# Patient Record
Sex: Female | Born: 1970 | Race: White | Hispanic: No | Marital: Single | Smoking: Current every day smoker
Health system: Southern US, Community
[De-identification: ages and names within clinical notes are randomized; demographics above are authoritative.]

## PROBLEM LIST (undated history)

## (undated) DIAGNOSIS — J45909 Unspecified asthma, uncomplicated: Secondary | ICD-10-CM

## (undated) DIAGNOSIS — M549 Dorsalgia, unspecified: Secondary | ICD-10-CM

## (undated) DIAGNOSIS — F419 Anxiety disorder, unspecified: Secondary | ICD-10-CM

## (undated) DIAGNOSIS — M1990 Unspecified osteoarthritis, unspecified site: Secondary | ICD-10-CM

## (undated) HISTORY — PX: TUBAL LIGATION: SHX77

---

## 2004-09-17 ENCOUNTER — Inpatient Hospital Stay: Payer: Self-pay

## 2005-09-12 ENCOUNTER — Emergency Department: Payer: Self-pay | Admitting: General Practice

## 2006-01-06 ENCOUNTER — Emergency Department: Payer: Self-pay | Admitting: Emergency Medicine

## 2006-03-20 ENCOUNTER — Emergency Department: Payer: Self-pay | Admitting: Emergency Medicine

## 2006-03-22 ENCOUNTER — Other Ambulatory Visit: Payer: Self-pay

## 2006-03-22 ENCOUNTER — Emergency Department: Payer: Self-pay | Admitting: Emergency Medicine

## 2006-07-09 ENCOUNTER — Emergency Department: Payer: Self-pay

## 2007-01-29 ENCOUNTER — Emergency Department: Payer: Self-pay | Admitting: Emergency Medicine

## 2007-01-30 ENCOUNTER — Emergency Department: Payer: Self-pay | Admitting: Emergency Medicine

## 2007-11-09 ENCOUNTER — Emergency Department: Payer: Self-pay | Admitting: Internal Medicine

## 2007-11-11 ENCOUNTER — Inpatient Hospital Stay: Payer: Self-pay | Admitting: Internal Medicine

## 2007-11-13 ENCOUNTER — Inpatient Hospital Stay: Payer: Self-pay | Admitting: Internal Medicine

## 2007-11-13 ENCOUNTER — Other Ambulatory Visit: Payer: Self-pay

## 2008-09-05 ENCOUNTER — Emergency Department: Payer: Self-pay | Admitting: Emergency Medicine

## 2009-05-12 ENCOUNTER — Inpatient Hospital Stay: Payer: Self-pay | Admitting: Internal Medicine

## 2009-07-02 ENCOUNTER — Inpatient Hospital Stay: Payer: Self-pay | Admitting: Internal Medicine

## 2009-10-09 ENCOUNTER — Observation Stay: Payer: Self-pay | Admitting: Internal Medicine

## 2009-11-07 ENCOUNTER — Emergency Department: Payer: Self-pay | Admitting: Emergency Medicine

## 2010-01-04 ENCOUNTER — Observation Stay: Payer: Self-pay | Admitting: Internal Medicine

## 2010-02-22 ENCOUNTER — Emergency Department: Payer: Self-pay | Admitting: Emergency Medicine

## 2010-02-27 ENCOUNTER — Emergency Department: Payer: Self-pay | Admitting: Emergency Medicine

## 2010-11-28 ENCOUNTER — Emergency Department: Payer: Self-pay | Admitting: Emergency Medicine

## 2010-11-30 ENCOUNTER — Inpatient Hospital Stay: Payer: Self-pay | Admitting: Internal Medicine

## 2011-01-23 ENCOUNTER — Emergency Department: Payer: Self-pay | Admitting: Unknown Physician Specialty

## 2011-05-11 IMAGING — CR DG CHEST 2V
1 series · 2 of 2 positions shown · non-contrast
Comparison: none

REASON FOR EXAM: sob
COMMENTS:

PROCEDURE:     DXR - DXR CHEST PA (OR AP) AND LATERAL  - October 10, 2009 [DATE]
RESULT:     Comparison: 10/09/2009

[Series 1: view not recorded · 0.17mm/px · 2 of 2 slices shown]
[im 1/2]
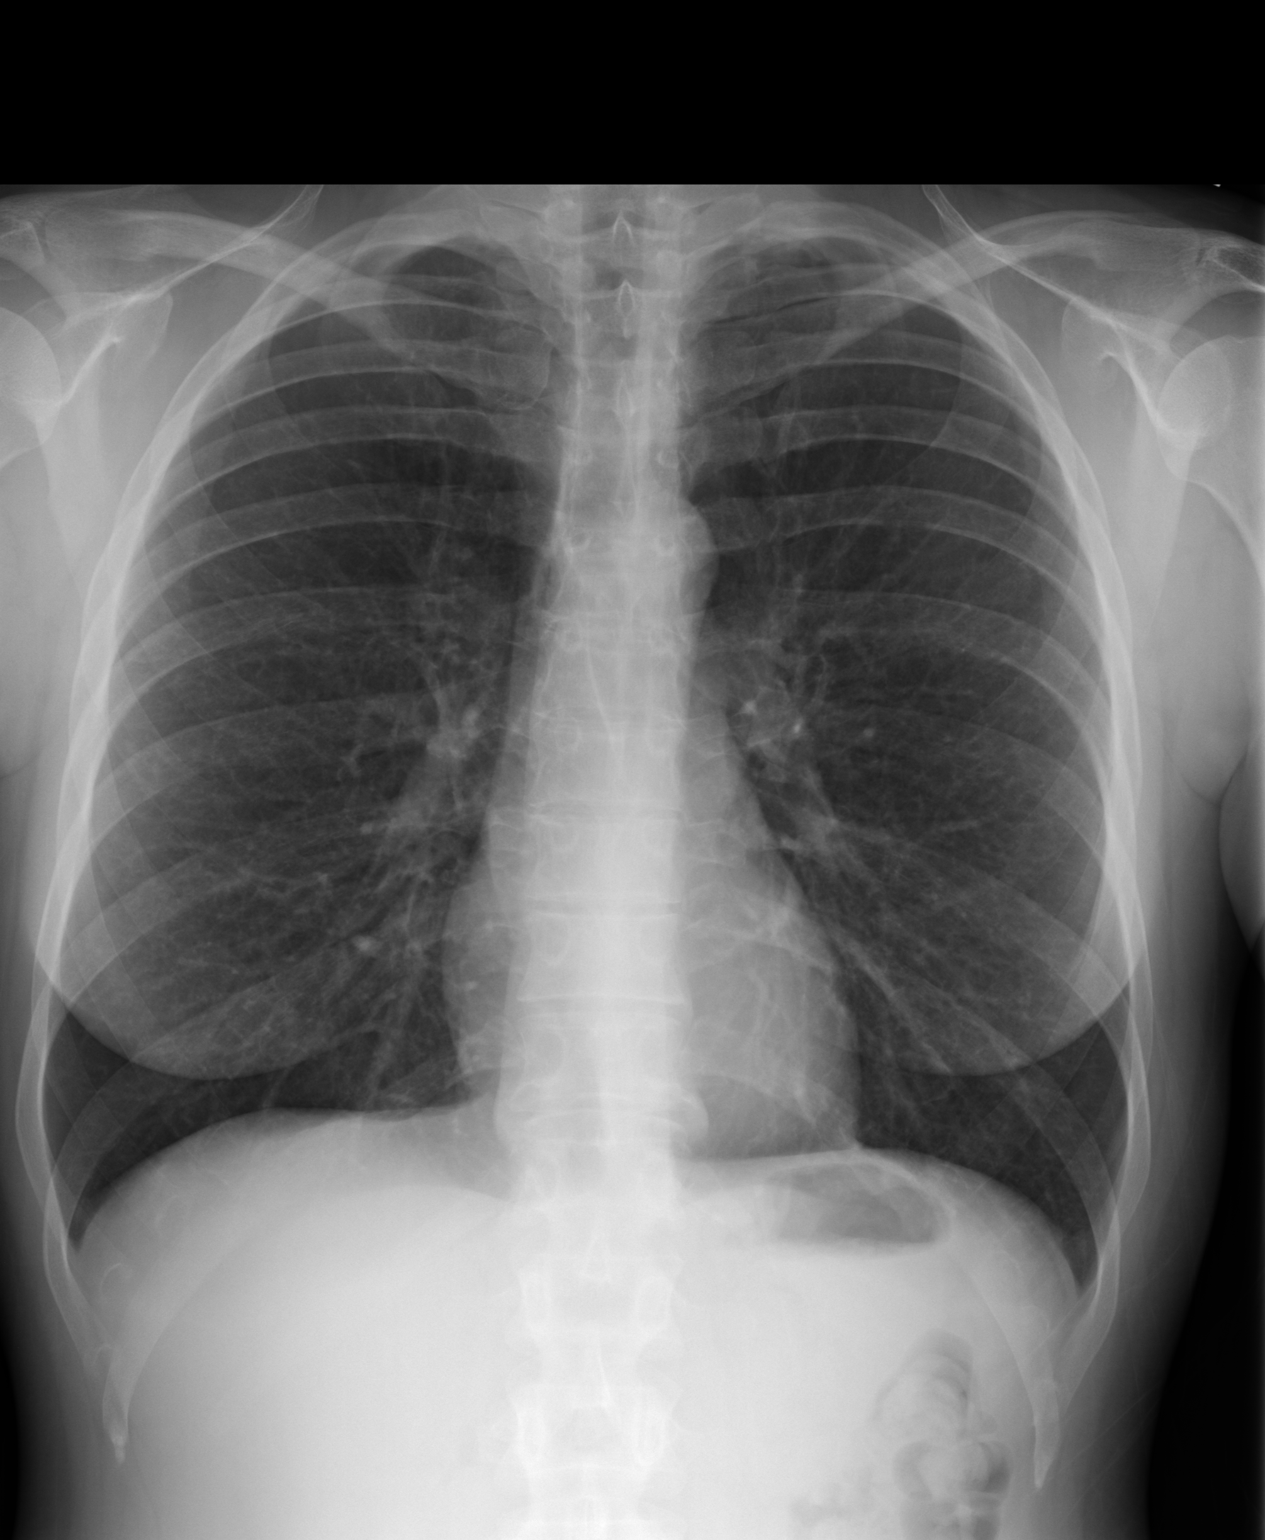
[im 2/2]
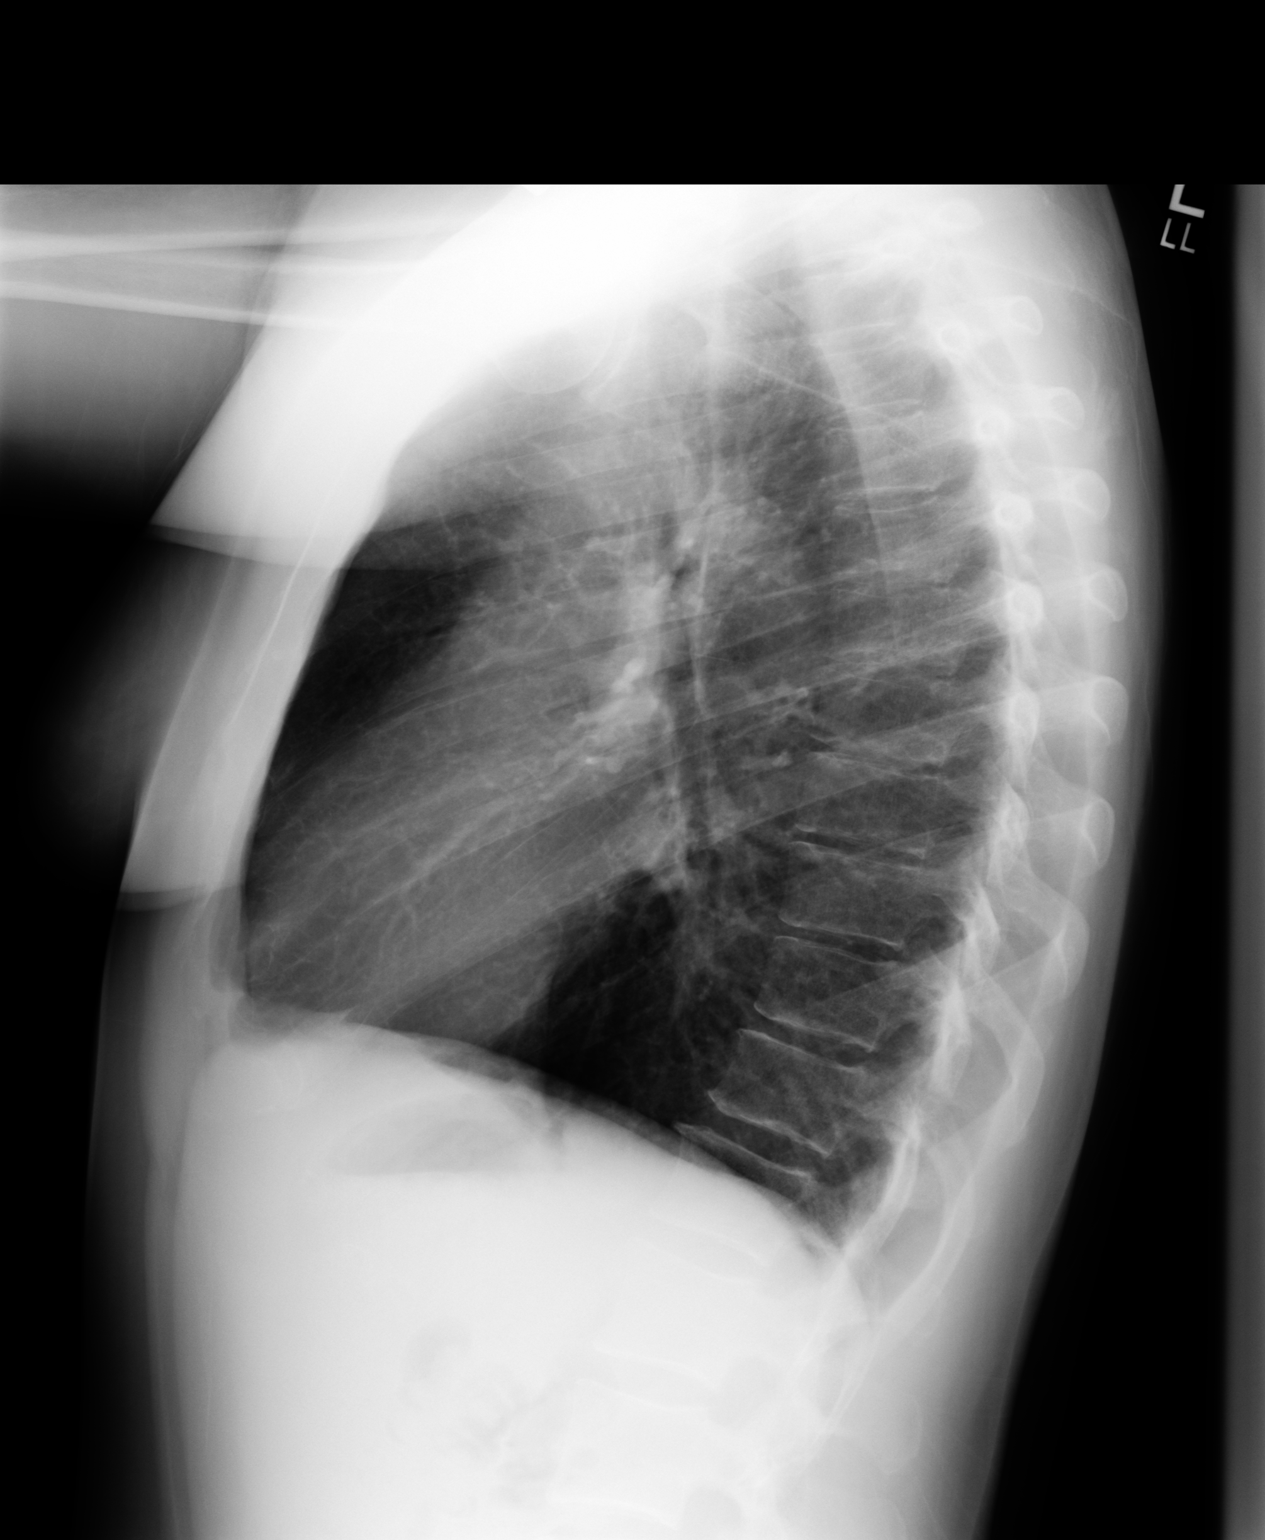

[2 of 2 positions shown; findings below may reference images not displayed]

FINDINGS: PA and lateral chest radiographs are provided. The lungs are hyperinflated
likely secondary to COPD. There is no focal parenchymal opacity, pleural
effusion, or pneumothorax. The heart and mediastinum are unremarkable. The
osseous structures are unremarkable.
IMPRESSION: No acute disease of the chest.

## 2011-12-15 ENCOUNTER — Emergency Department: Payer: Self-pay | Admitting: Emergency Medicine

## 2012-06-28 IMAGING — CR DG CHEST 1V PORT
1 series · 1 of 1 positions shown · non-contrast
Comparison: none

REASON FOR EXAM: sob
COMMENTS:

PROCEDURE:     DXR - DXR PORTABLE CHEST SINGLE VIEW  - November 28, 2010  [DATE]
RESULT:     Comparison is made to the prior exam of 02/22/2010.
The lung fields are clear. The heart, mediastinal and osseous structures
reveal no significant abnormalities.

[view not recorded]
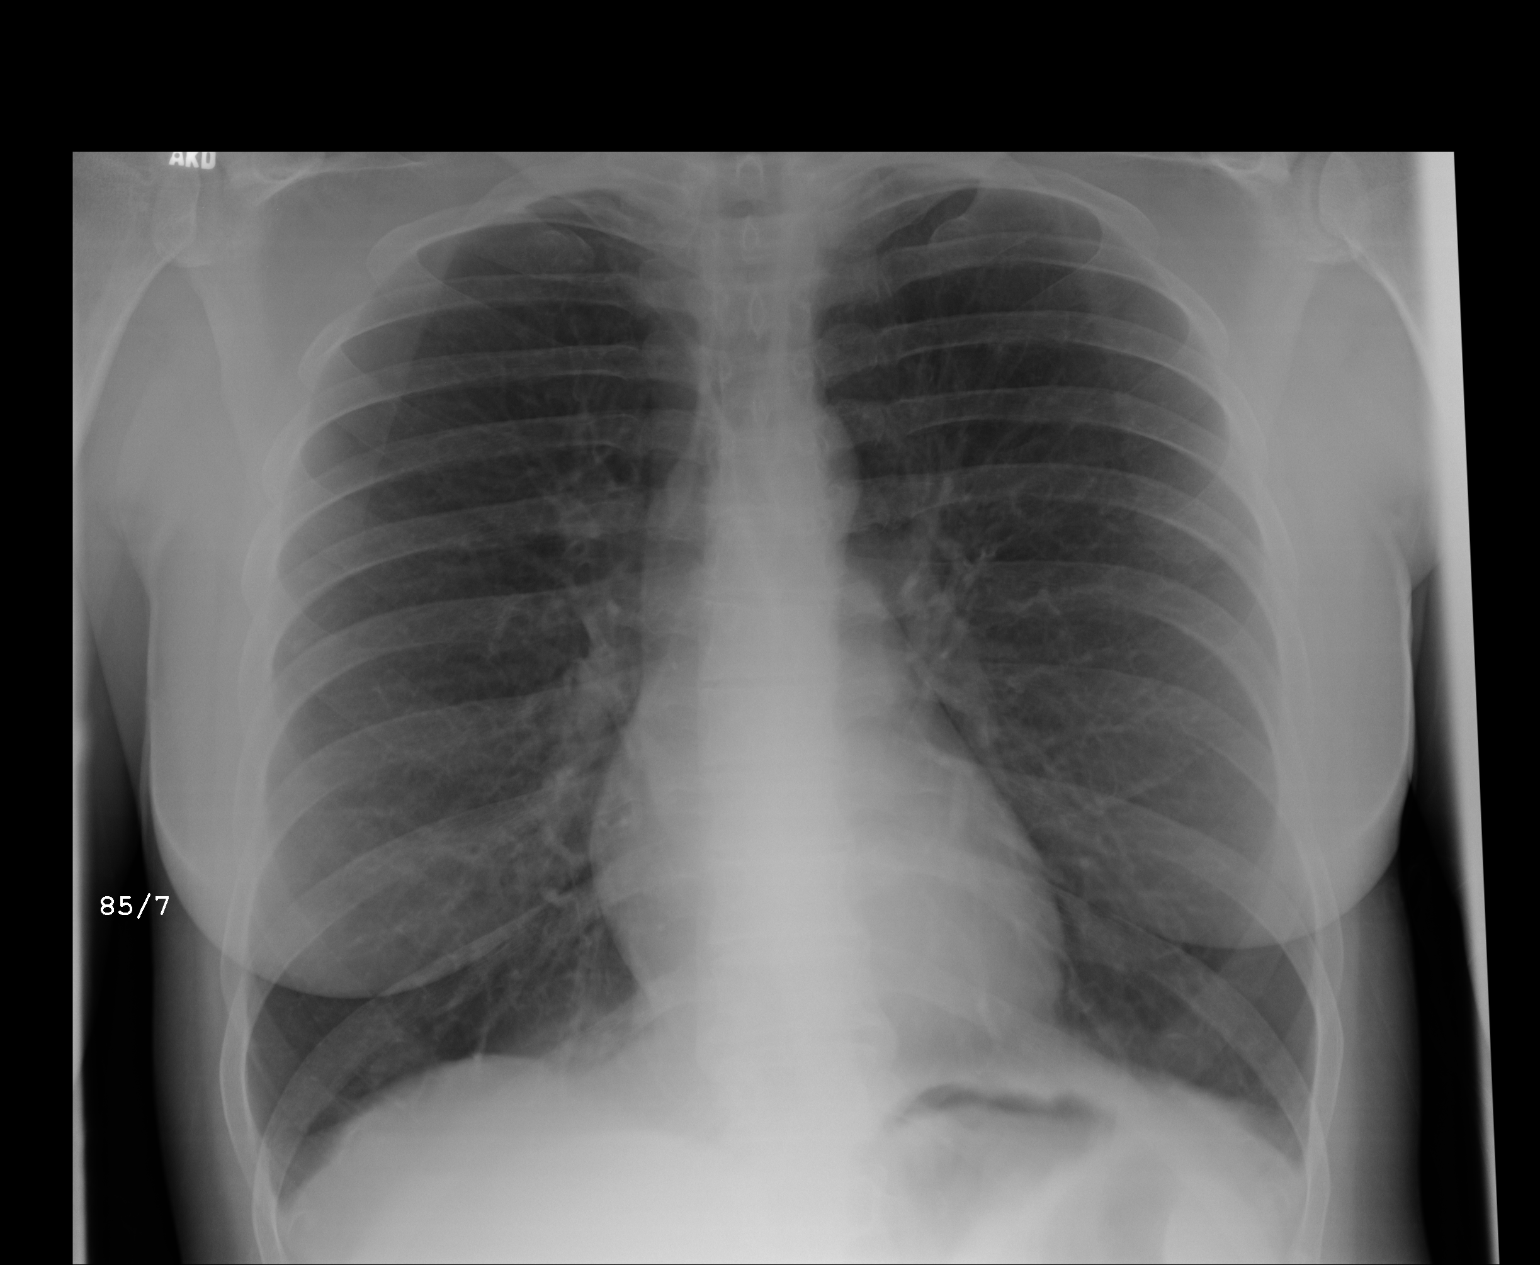

[1 of 1 positions shown; findings below may reference images not displayed]

IMPRESSION: No acute changes are identified.

## 2012-06-30 IMAGING — CR DG CHEST 2V
1 series · 2 of 2 positions shown · non-contrast
Comparison: none

REASON FOR EXAM: Shortness of Breath
COMMENTS:   May transport without cardiac monitor

[Series 1: view not recorded · 0.17mm/px · 2 of 2 slices shown]
[im 1/2]
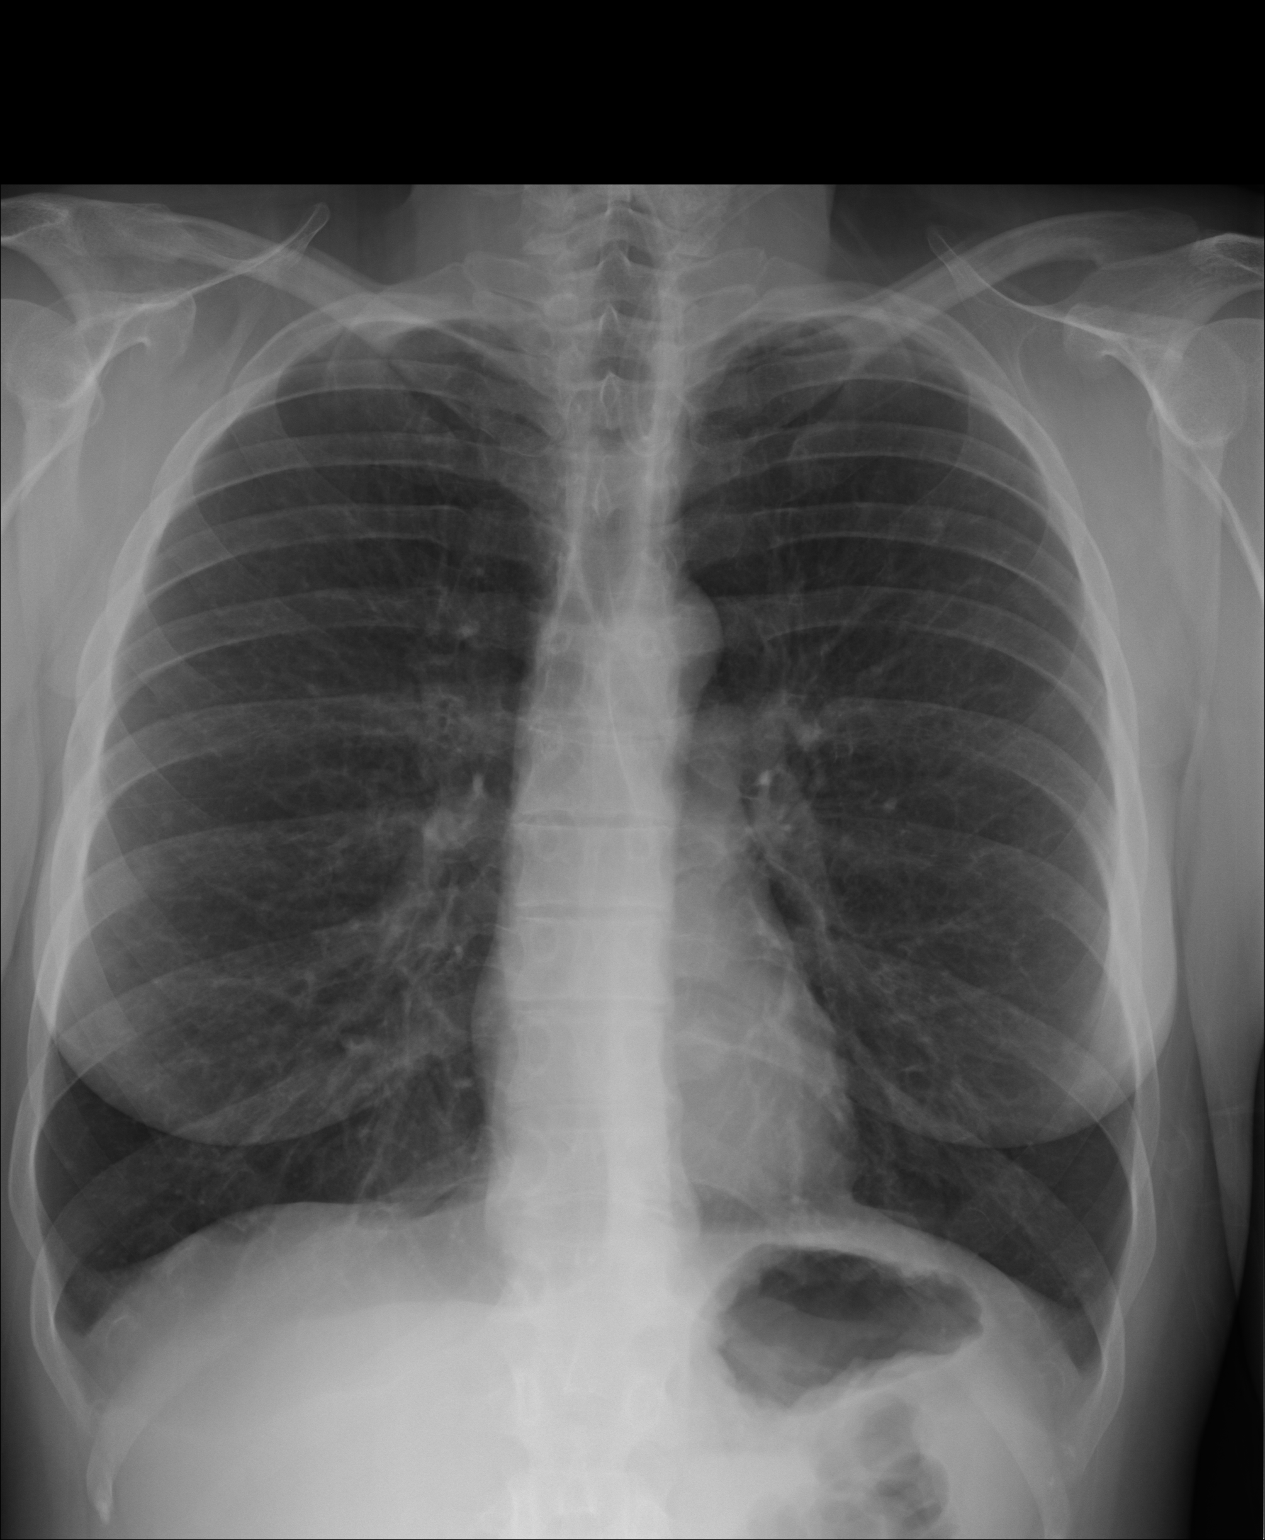
[im 2/2]
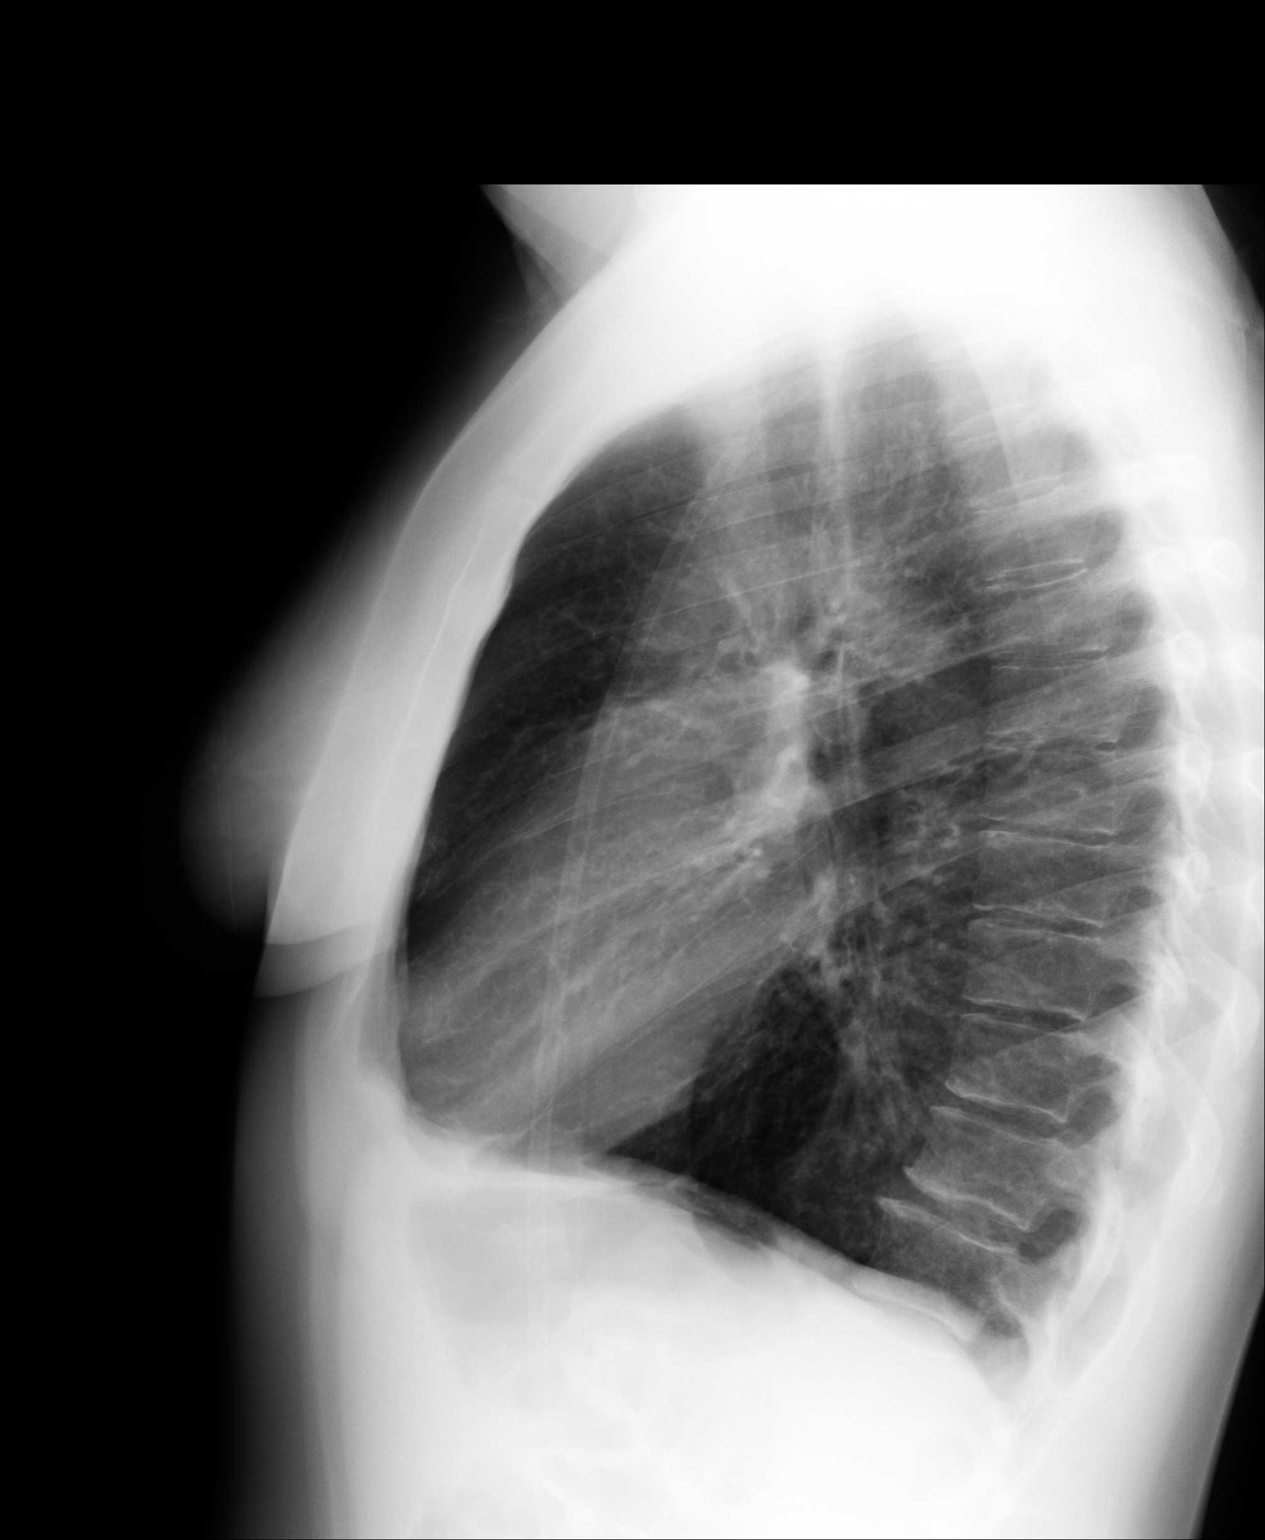

[2 of 2 positions shown; findings below may reference images not displayed]

PROCEDURE:     DXR - DXR CHEST PA (OR AP) AND LATERAL  - November 30, 2010  [DATE]

RESULT:     Comparison is made to the prior exam of 11/28/2010.

The lung fields are clear. No pneumonia, pneumothorax or pleural effusion is
seen. The heart size is normal. The chest is bilaterally hyperexpanded
compatible with a history of COPD or asthma.
IMPRESSION: 1.  The lung fields are clear.
2.  The chest is bilaterally hyperexpanded compatible with a history of COPD
or asthma.

## 2012-09-01 ENCOUNTER — Emergency Department: Payer: Self-pay | Admitting: Emergency Medicine

## 2012-09-06 ENCOUNTER — Emergency Department: Payer: Self-pay | Admitting: Emergency Medicine

## 2012-09-08 LAB — BETA STREP CULTURE(ARMC)

## 2012-10-31 ENCOUNTER — Emergency Department: Payer: Self-pay | Admitting: Internal Medicine

## 2012-11-02 LAB — BETA STREP CULTURE(ARMC)

## 2013-01-29 ENCOUNTER — Emergency Department: Payer: Self-pay | Admitting: Emergency Medicine

## 2013-01-30 ENCOUNTER — Emergency Department: Payer: Self-pay | Admitting: Emergency Medicine

## 2013-02-03 ENCOUNTER — Emergency Department: Payer: Self-pay | Admitting: Emergency Medicine

## 2013-02-23 ENCOUNTER — Emergency Department: Payer: Self-pay | Admitting: Internal Medicine

## 2013-05-22 ENCOUNTER — Emergency Department: Payer: Self-pay | Admitting: Emergency Medicine

## 2013-05-22 LAB — RAPID INFLUENZA A&B ANTIGENS

## 2013-08-22 ENCOUNTER — Emergency Department: Payer: Self-pay | Admitting: Emergency Medicine

## 2013-08-24 ENCOUNTER — Emergency Department: Payer: Self-pay | Admitting: Emergency Medicine

## 2013-08-26 LAB — BETA STREP CULTURE(ARMC)

## 2014-02-18 ENCOUNTER — Emergency Department: Payer: Self-pay | Admitting: Emergency Medicine
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
## 2015-01-29 ENCOUNTER — Encounter: Payer: Self-pay | Admitting: Emergency Medicine

## 2015-01-29 ENCOUNTER — Emergency Department: Payer: Self-pay

## 2015-01-29 ENCOUNTER — Emergency Department
Admission: EM | Admit: 2015-01-29 | Discharge: 2015-01-29 | Disposition: A | Discharge status: Home / Self Care | Payer: Self-pay | Attending: Emergency Medicine | Admitting: Emergency Medicine

## 2015-01-29 DIAGNOSIS — Z7951 Long term (current) use of inhaled steroids: Secondary | ICD-10-CM | POA: Insufficient documentation

## 2015-01-29 DIAGNOSIS — J4521 Mild intermittent asthma with (acute) exacerbation: Secondary | ICD-10-CM | POA: Insufficient documentation

## 2015-01-29 DIAGNOSIS — F17200 Nicotine dependence, unspecified, uncomplicated: Secondary | ICD-10-CM

## 2015-01-29 DIAGNOSIS — J029 Acute pharyngitis, unspecified: Secondary | ICD-10-CM | POA: Insufficient documentation

## 2015-01-29 DIAGNOSIS — Z720 Tobacco use: Secondary | ICD-10-CM | POA: Insufficient documentation

## 2015-01-29 HISTORY — DX: Unspecified asthma, uncomplicated: J45.909

## 2015-01-29 MED ORDER — IPRATROPIUM-ALBUTEROL 0.5-2.5 (3) MG/3ML IN SOLN
3.0000 mL | Freq: Once | RESPIRATORY_TRACT | Status: AC
  Administered 2015-01-29: 3 mL via RESPIRATORY_TRACT
  Filled 2015-01-29: qty 3

## 2015-01-29 MED ORDER — HYDROXYZINE HCL 25 MG PO TABS
25.0000 mg | ORAL_TABLET | Freq: Once | ORAL | Status: AC
  Administered 2015-01-29: 25 mg via ORAL

## 2015-01-29 MED ORDER — BENZONATATE 100 MG PO CAPS: 100.0000 mg | ORAL_CAPSULE | Freq: Three times a day (TID) | ORAL | Status: DC | PRN

## 2015-01-29 MED ORDER — PREDNISONE 10 MG PO TABS: ORAL_TABLET | ORAL | Status: DC

## 2015-01-29 MED ORDER — HYDROXYZINE HCL 25 MG PO TABS
ORAL_TABLET | ORAL | Status: AC
  Filled 2015-01-29: qty 1

## 2015-01-29 MED ORDER — PREDNISONE 20 MG PO TABS
60.0000 mg | ORAL_TABLET | Freq: Once | ORAL | Status: AC
  Administered 2015-01-29: 60 mg via ORAL
  Filled 2015-01-29: qty 3

## 2015-01-29 NOTE — ED Notes (Signed)
Reports hx of asthma.  Today sob is worse.  No resp distress noted at this time

## 2015-01-29 NOTE — ED Provider Notes (Signed)
[facility] Emergency Department Provider Note  ____________________________________________  Time seen: Approximately 9:28 AM  I have reviewed the triage vital signs and the nursing notes.   HISTORY  Chief Complaint Shortness of Breath   HPI Donna Howard is a 44 y.o. female is here with complaint of asthma. Patient states she has been short of breath for approximately 2 weeks but is worse today. She states that she is a patient at [facility] and ordered a inhaler but that they could not see her today. She came to the emergency room but his plane to go back to [facility] to pick up her inhaler. She denies any known fever. There is slight productive cough that keeps her awake at night. Patient states her pain is 3 out of 10 from coughing. She continues to smoke.   Past Medical History  Diagnosis Date  . Asthma     There are no active problems to display for this patient.   Past Surgical History  Procedure Laterality Date  . Tubal ligation      Current Outpatient Rx  Name  Route  Sig  Dispense  Refill  . albuterol (PROVENTIL HFA;VENTOLIN HFA) 108 (90 BASE) MCG/ACT inhaler   Inhalation   Inhale 2 puffs into the lungs every 6 (six) hours as needed for wheezing or shortness of breath.         . beclomethasone (QVAR) 80 MCG/ACT inhaler   Inhalation   Inhale 1 puff into the lungs 2 (two) times daily.         . benzonatate (TESSALON PERLES) 100 MG capsule   Oral   Take 1 capsule (100 mg total) by mouth 3 (three) times daily as needed for cough.   30 capsule   0   . predniSONE (DELTASONE) 10 MG tablet      Take 6 tablets  today, on day 2 take 5 tablets, day 3 take 4 tablets, day 4 take 3 tablets, day 5 take  2 tablets and 1 tablet the last day   21 tablet   0     Allergies Review of patient's allergies indicates no known allergies.  History reviewed. No pertinent family history.  Social History Social History  Substance Use Topics   . Smoking status: Current Every Day Smoker  . Smokeless tobacco: None  . Alcohol Use: No    Review of Systems Constitutional: No fever/chills Eyes: No visual changes. ENT: Positive sore throat. Cardiovascular: Denies chest pain. Respiratory: Positive shortness of breath. Gastrointestinal: No abdominal pain.  No nausea, no vomiting.  No diarrhea.  No constipation. Genitourinary: Negative for dysuria. Musculoskeletal: Negative for back pain. Skin: Negative for rash. Neurological: Negative for headaches, focal weakness or numbness.  10-point ROS otherwise negative.  ____________________________________________   PHYSICAL EXAM:  VITAL SIGNS: ED Triage Vitals  Enc Vitals Group     BP 01/29/15 0921 121/83 mmHg     Pulse Rate 01/29/15 0921 88     Resp 01/29/15 0921 18     Temp 01/29/15 0921 97.6 F (36.4 C)     Temp Source 01/29/15 0921 Oral     SpO2 01/29/15 0921 98 %     Weight 01/29/15 0921 140 lb (63.504 kg)     Height 01/29/15 0921 5\' 7"  (1.702 m)     Head Cir --      Peak Flow --      Pain Score 01/29/15 0904 3     Pain Loc --  Pain Edu? --      Excl. in GC? --     Constitutional: Alert and oriented. Well appearing and in no acute distress. Eyes: Conjunctivae are normal. PERRL. EOMI. Head: Atraumatic. Nose: No congestion/rhinnorhea. Mouth/Throat: Mucous membranes are moist.  Oropharynx non-erythematous. Neck: No stridor.   Hematological/Lymphatic/Immunilogical: No cervical lymphadenopathy. Cardiovascular: Normal rate, regular rhythm. Grossly normal heart sounds.  Good peripheral circulation. Respiratory: Normal respiratory effort.  No retractions. Lungs expiratory wheezes heard throughout. Patient is able to speak in complete sentences without any difficulty. Gastrointestinal: Soft and nontender. No distention. No abdominal bruits. No CVA tenderness. Musculoskeletal: No lower extremity tenderness nor edema.  No joint effusions. Neurologic:  Normal speech  and language. No gross focal neurologic deficits are appreciated. No gait instability. Skin:  Skin is warm, dry and intact. No rash noted. Psychiatric: Mood and affect are normal. Speech and behavior are normal.  ____________________________________________   LABS (all labs ordered are listed, but only abnormal results are displayed)  Labs Reviewed - No data to display   RADIOLOGY  Chest x-ray per radiologist shows no edema or consolidation. I, Tommi Rumps, personally viewed and evaluated these images (plain radiographs) as part of my medical decision making.  ____________________________________________   PROCEDURES  Procedure(s) performed: None  Critical Care performed: No  ____________________________________________   INITIAL IMPRESSION / ASSESSMENT AND PLAN / ED COURSE  Pertinent labs & imaging results that were available during my care of the patient were reviewed by me and considered in my medical decision making (see chart for details).  Patient was given prednisone while in the emergency room and a nebulizer treatment. She requested something to make her calm secondary to prednisone and nebulizer treatment always makes her jittery. She was given Vistaril 25 mg 1 time dose. She is also to begin a prednisone taper beginning tomorrow, Jerilynn Som as needed for cough, and pick up her inhaler at [facility]. ____________________________________________   FINAL CLINICAL IMPRESSION(S) / ED DIAGNOSES  Final diagnoses:  Asthma, mild intermittent, with acute exacerbation  Smoker      Tommi Rumps, PA-C 01/29/15 1205  Sharyn Creamer, MD 01/30/15 [phone]

## 2015-01-29 NOTE — Discharge Instructions (Signed)

## 2015-08-02 ENCOUNTER — Encounter: Payer: Self-pay | Admitting: *Deleted

## 2015-08-02 ENCOUNTER — Emergency Department
Admission: EM | Admit: 2015-08-02 | Discharge: 2015-08-02 | Disposition: A | Discharge status: Home / Self Care | Payer: Self-pay | Attending: Emergency Medicine | Admitting: Emergency Medicine

## 2015-08-02 DIAGNOSIS — J4520 Mild intermittent asthma, uncomplicated: Secondary | ICD-10-CM

## 2015-08-02 DIAGNOSIS — J4521 Mild intermittent asthma with (acute) exacerbation: Secondary | ICD-10-CM | POA: Insufficient documentation

## 2015-08-02 DIAGNOSIS — J069 Acute upper respiratory infection, unspecified: Secondary | ICD-10-CM | POA: Insufficient documentation

## 2015-08-02 DIAGNOSIS — Z7951 Long term (current) use of inhaled steroids: Secondary | ICD-10-CM | POA: Insufficient documentation

## 2015-08-02 DIAGNOSIS — F17200 Nicotine dependence, unspecified, uncomplicated: Secondary | ICD-10-CM | POA: Insufficient documentation

## 2015-08-02 MED ORDER — BENZONATATE 200 MG PO CAPS: 200.0000 mg | ORAL_CAPSULE | Freq: Three times a day (TID) | ORAL | Status: DC | PRN

## 2015-08-02 MED ORDER — MAGIC MOUTHWASH W/LIDOCAINE: 5.0000 mL | Freq: Four times a day (QID) | ORAL | Status: DC

## 2015-08-02 MED ORDER — CETIRIZINE HCL 10 MG PO TABS: 10.0000 mg | ORAL_TABLET | Freq: Every day | ORAL | Status: AC

## 2015-08-02 MED ORDER — FLUTICASONE PROPIONATE 50 MCG/ACT NA SUSP: 1.0000 | Freq: Two times a day (BID) | NASAL | Status: DC

## 2015-08-02 MED ORDER — PREDNISONE 10 MG PO TABS: 10.0000 mg | ORAL_TABLET | ORAL | Status: DC

## 2015-08-02 NOTE — Discharge Instructions (Signed)
Asthma, Adult °Asthma is a recurring condition in which the airways tighten and narrow. Asthma can make it difficult to breathe. It can cause coughing, wheezing, and shortness of breath. Asthma episodes, also called asthma attacks, range from minor to life-threatening. Asthma cannot be cured, but medicines and lifestyle changes can help control it. °CAUSES °Asthma is believed to be caused by inherited (genetic) and environmental factors, but its exact cause is unknown. Asthma may be triggered by allergens, lung infections, or irritants in the air. Asthma triggers are different for each person. Common triggers include:  °· Animal dander. °· Dust mites. °· Cockroaches. °· Pollen from trees or grass. °· Mold. °· Smoke. °· Air pollutants such as dust, household cleaners, hair sprays, aerosol sprays, paint fumes, strong chemicals, or strong odors. °· Cold air, weather changes, and winds (which increase molds and pollens in the air). °· Strong emotional expressions such as crying or laughing hard. °· Stress. °· Certain medicines (such as aspirin) or types of drugs (such as beta-blockers). °· Sulfites in foods and drinks. Foods and drinks that may contain sulfites include dried fruit, potato chips, and sparkling grape juice. °· Infections or inflammatory conditions such as the flu, a cold, or an inflammation of the nasal membranes (rhinitis). °· Gastroesophageal reflux disease (GERD). °· Exercise or strenuous activity. °SYMPTOMS °Symptoms may occur immediately after asthma is triggered or many hours later. Symptoms include: °· Wheezing. °· Excessive nighttime or early morning coughing. °· Frequent or severe coughing with a common cold. °· Chest tightness. °· Shortness of breath. °DIAGNOSIS  °The diagnosis of asthma is made by a review of your medical history and a physical exam. Tests may also be performed. These may include: °· Lung function studies. These tests show how much air you breathe in and out. °· Allergy  tests. °· Imaging tests such as X-rays. °TREATMENT  °Asthma cannot be cured, but it can usually be controlled. Treatment involves identifying and avoiding your asthma triggers. It also involves medicines. There are 2 classes of medicine used for asthma treatment:  °· Controller medicines. These prevent asthma symptoms from occurring. They are usually taken every day. °· Reliever or rescue medicines. These quickly relieve asthma symptoms. They are used as needed and provide short-term relief. °Your health care provider will help you create an asthma action plan. An asthma action plan is a written plan for managing and treating your asthma attacks. It includes a list of your asthma triggers and how they may be avoided. It also includes information on when medicines should be taken and when their dosage should be changed. An action plan may also involve the use of a device called a peak flow meter. A peak flow meter measures how well the lungs are working. It helps you monitor your condition. °HOME CARE INSTRUCTIONS  °· Take medicines only as directed by your health care provider. Speak with your health care provider if you have questions about how or when to take the medicines. °· Use a peak flow meter as directed by your health care provider. Record and keep track of readings. °· Understand and use the action plan to help minimize or stop an asthma attack without needing to seek medical care. °· Control your home environment in the following ways to help prevent asthma attacks: °· Do not smoke. Avoid being exposed to secondhand smoke. °· Change your heating and air conditioning filter regularly. °· Limit your use of fireplaces and wood stoves. °· Get rid of pests (such as roaches   and mice) and their droppings.  Throw away plants if you see mold on them.  Clean your floors and dust regularly. Use unscented cleaning products.  Try to have someone else vacuum for you regularly. Stay out of rooms while they are  being vacuumed and for a short while afterward. If you vacuum, use a dust mask from a hardware store, a double-layered or microfilter vacuum cleaner bag, or a vacuum cleaner with a HEPA filter.  Replace carpet with wood, tile, or vinyl flooring. Carpet can trap dander and dust.  Use allergy-proof pillows, mattress covers, and box spring covers.  Wash bed sheets and blankets every week in hot water and dry them in a dryer.  Use blankets that are made of polyester or cotton.  Clean bathrooms and kitchens with bleach. If possible, have someone repaint the walls in these rooms with mold-resistant paint. Keep out of the rooms that are being cleaned and painted.  Wash hands frequently. SEEK MEDICAL CARE IF:   You have wheezing, shortness of breath, or a cough even if taking medicine to prevent attacks.  The colored mucus you cough up (sputum) is thicker than usual.  Your sputum changes from clear or white to yellow, green, gray, or bloody.  You have any problems that may be related to the medicines you are taking (such as a rash, itching, swelling, or trouble breathing).  You are using a reliever medicine more than 2-3 times per week.  Your peak flow is still at 50-79% of your personal best after following your action plan for 1 hour.  You have a fever. SEEK IMMEDIATE MEDICAL CARE IF:   You seem to be getting worse and are unresponsive to treatment during an asthma attack.  You are short of breath even at rest.  You get short of breath when doing very little physical activity.  You have difficulty eating, drinking, or talking due to asthma symptoms.  You develop chest pain.  You develop a fast heartbeat.  You have a bluish color to your lips or fingernails.  You are light-headed, dizzy, or faint.  Your peak flow is less than 50% of your personal best.   This information is not intended to replace advice given to you by your health care provider. Make sure you discuss any  questions you have with your health care provider.   Document Released: 05/08/2005 Document Revised: 01/27/2015 Document Reviewed: 12/05/2012 Elsevier Interactive Patient Education 2016 [employer] Inc[employer]  Viral Infections A viral infection can be caused by different types of viruses.Most viral infections are not serious and resolve on their own. However, some infections may cause severe symptoms and may lead to further complications. SYMPTOMS Viruses can frequently cause:  Minor sore throat.  Aches and pains.  Headaches.  Runny nose.  Different types of rashes.  Watery eyes.  Tiredness.  Cough.  Loss of appetite.  Gastrointestinal infections, resulting in nausea, vomiting, and diarrhea. These symptoms do not respond to antibiotics because the infection is not caused by bacteria. However, you might catch a bacterial infection following the viral infection. This is sometimes called a "superinfection." Symptoms of such a bacterial infection may include:  Worsening sore throat with pus and difficulty swallowing.  Swollen neck glands.  Chills and a high or persistent fever.  Severe headache.  Tenderness over the sinuses.  Persistent overall ill feeling (malaise), muscle aches, and tiredness (fatigue).  Persistent cough.  Yellow, green, or brown mucus production with coughing. HOME CARE INSTRUCTIONS   Only take  over-the-counter or prescription medicines for pain, discomfort, diarrhea, or fever as directed by your caregiver.  Drink enough water and fluids to keep your urine clear or pale yellow. Sports drinks can provide valuable electrolytes, sugars, and hydration.  Get plenty of rest and maintain proper nutrition. Soups and broths with crackers or rice are fine. SEEK IMMEDIATE MEDICAL CARE IF:   You have severe headaches, shortness of breath, chest pain, neck pain, or an unusual rash.  You have uncontrolled vomiting, diarrhea, or you are unable to keep down  fluids.  You or your child has an oral temperature above 102 F (38.9 C), not controlled by medicine.  Your baby is older than 3 months with a rectal temperature of 102 F (38.9 C) or higher.  Your baby is 343 months old or younger with a rectal temperature of 100.4 F (38 C) or higher. MAKE SURE YOU:   Understand these instructions.  Will watch your condition.  Will get help right away if you are not doing well or get worse.   This information is not intended to replace advice given to you by your health care provider. Make sure you discuss any questions you have with your health care provider.   Document Released: 02/15/2005 Document Revised: 07/31/2011 Document Reviewed: 10/14/2014 Elsevier Interactive Patient Education [employer]2016 [employer] Inc.

## 2015-08-02 NOTE — ED Notes (Signed)
Pt has a sore throat, left earache and a cough.  Sx for 1 week.

## 2015-08-02 NOTE — ED Provider Notes (Signed)
[facility] Emergency Department Provider Note  ____________________________________________  Time seen: Approximately 10:37 PM  I have reviewed the triage vital signs and the nursing notes.   HISTORY  Chief Complaint Sore Throat and Otalgia    HPI Donna Howard is a 45 y.o. female smoker with a history of asthma who presents for a nonproductive cough for the past week and sore throat and ear pain since Friday. She has dysphagia, rhinorrhea, headache, shortness of breath with coughing. She denies otorrhea, sinus pain, nausea, vomiting, diarrhea, abdominal pain, and chest pain. She ranks her symptoms an 8/10. The patient had mild improvement with her albuterol. She did not have relief with Advil, Aleve, and mucinex.    Past Medical History  Diagnosis Date  . Asthma     There are no active problems to display for this patient.   Past Surgical History  Procedure Laterality Date  . Tubal ligation      Current Outpatient Rx  Name  Route  Sig  Dispense  Refill  . albuterol (PROVENTIL HFA;VENTOLIN HFA) 108 (90 BASE) MCG/ACT inhaler   Inhalation   Inhale 2 puffs into the lungs every 6 (six) hours as needed for wheezing or shortness of breath.         . beclomethasone (QVAR) 80 MCG/ACT inhaler   Inhalation   Inhale 1 puff into the lungs 2 (two) times daily.         . benzonatate (TESSALON) 200 MG capsule   Oral   Take 1 capsule (200 mg total) by mouth 3 (three) times daily as needed for cough.   21 capsule   0   . cetirizine (ZYRTEC) 10 MG tablet   Oral   Take 1 tablet (10 mg total) by mouth daily.   30 tablet   0   . fluticasone (FLONASE) 50 MCG/ACT nasal spray   Each Nare   Place 1 spray into both nostrils 2 (two) times daily.   16 g   0   . magic mouthwash w/lidocaine SOLN   Oral   Take 5 mLs by mouth 4 (four) times daily.   240 mL   0     Dispense in a 1/1/1/1 ratio. Use lidocaine, diphen ...   . predniSONE (DELTASONE) 10 MG  tablet   Oral   Take 1 tablet (10 mg total) by mouth as directed.   21 tablet   0     Take on a daily basis of 6, 5, 4, 3, 2, 1     Allergies Review of patient's allergies indicates no known allergies.  No family history on file.  Social History Social History  Substance Use Topics  . Smoking status: Current Every Day Smoker  . Smokeless tobacco: None  . Alcohol Use: No     Review of Systems   Constitutional: No fever/chills Eyes: No visual changes. No discharge ENT: Positive for sore throat. Cardiovascular: no chest pain. Respiratory: no cough. Positive for SOB with coughing. Gastrointestinal: No abdominal pain.  No nausea, no vomiting.  No diarrhea.  Neurological: Positive for headache.  10-point ROS otherwise negative.  ____________________________________________   PHYSICAL EXAM:  VITAL SIGNS: ED Triage Vitals  Enc Vitals Group     BP 08/02/15 2011 130/70 mmHg     Pulse Rate 08/02/15 2011 94     Resp 08/02/15 2011 18     Temp 08/02/15 2011 98.3 F (36.8 C)     Temp Source 08/02/15 2011 Oral  SpO2 08/02/15 2011 100 %     Weight 08/02/15 2011 125 lb (56.7 kg)     Height 08/02/15 2011  (1.702 m)     Head Cir --      Peak Flow --      Pain Score 08/02/15 2013 8     Pain Loc --      Pain Edu? --      Excl. in GC? --      Constitutional: Alert and oriented. Well appearing and in no acute distress. Hoarseness noted.  Eyes: Conjunctivae are normal. PERRL.  Head: Atraumatic. ENT:      Ears: TMs pearly grey, intact, with bony landmarks visible. No effusion or erythema.       Nose: No congestion/rhinnorhea.      Mouth/Throat: Mucous membranes are moist.  Hematological/Lymphatic/Immunilogical: No cervical lymphadenopathy. Cardiovascular: Normal rate, regular rhythm. Normal S1 and S2.   Respiratory: Normal respiratory effort without tachypnea or retractions. Wheezing bilaterally in all lung fields. No rhonchi or rales.  Gastrointestinal: Soft and  nontender. No distention. Neurologic:  Normal speech and language. No gross focal neurologic deficits are appreciated.  Skin:  Skin is warm, dry and intact. No rash noted. Psychiatric: Mood and affect are normal. Speech and behavior are normal. Patient exhibits appropriate insight and judgement.   ____________________________________________   LABS (all labs ordered are listed, but only abnormal results are displayed)  Labs Reviewed - No data to display ____________________________________________  EKG   ____________________________________________  RADIOLOGY   No results found.  ____________________________________________    PROCEDURES  Procedure(s) performed:       Medications - No data to display   ____________________________________________   INITIAL IMPRESSION / ASSESSMENT AND PLAN / ED COURSE  Pertinent labs & imaging results that were available during my care of the patient were reviewed by me and considered in my medical decision making (see chart for details).  Patient's diagnosis is consistent with viral upper respiratory illness complicating mild underlying asthma. Patient's exam is reassuring.. Patient will be discharged home with prescriptions for Flonase, Zyrtec, magic mouthwash, cough medication, and steroid taper. Patient is encouraged to use her albuterol inhaler every 4 hours until symptoms resolve.. Patient is to follow up with primary care provider if symptoms persist past this treatment course. Patient is given ED precautions to return to the ED for any worsening or new symptoms.     ____________________________________________  FINAL CLINICAL IMPRESSION(S) / ED DIAGNOSES  Final diagnoses:  Viral upper respiratory illness  Asthma, mild intermittent, uncomplicated      NEW MEDICATIONS STARTED DURING THIS VISIT:  New Prescriptions   BENZONATATE (TESSALON) 200 MG CAPSULE    Take 1 capsule (200 mg total) by mouth 3 (three) times  daily as needed for cough.   CETIRIZINE (ZYRTEC) 10 MG TABLET    Take 1 tablet (10 mg total) by mouth daily.   FLUTICASONE (FLONASE) 50 MCG/ACT NASAL SPRAY    Place 1 spray into both nostrils 2 (two) times daily.   MAGIC MOUTHWASH W/LIDOCAINE SOLN    Take 5 mLs by mouth 4 (four) times daily.   PREDNISONE (DELTASONE) 10 MG TABLET    Take 1 tablet (10 mg total) by mouth as directed.        This chart was dictated using voice recognition software/Dragon. Despite best efforts to proofread, errors can occur which can change the meaning. Any change was purely unintentional.    Racheal Patches, PA-C 08/02/15 3086  Arnaldo Natal, MD  08/03/15 0054 

## 2015-08-06 ENCOUNTER — Ambulatory Visit
Admission: EM | Admit: 2015-08-06 | Discharge: 2015-08-06 | Disposition: A | Discharge status: Home / Self Care | Payer: Self-pay | Attending: Family Medicine | Admitting: Family Medicine

## 2015-08-06 DIAGNOSIS — J0100 Acute maxillary sinusitis, unspecified: Secondary | ICD-10-CM

## 2015-08-06 DIAGNOSIS — J40 Bronchitis, not specified as acute or chronic: Secondary | ICD-10-CM

## 2015-08-06 MED ORDER — HYDROCOD POLST-CPM POLST ER 10-8 MG/5ML PO SUER: 5.0000 mL | Freq: Two times a day (BID) | ORAL | Status: DC | PRN

## 2015-08-06 MED ORDER — DOXYCYCLINE HYCLATE 100 MG PO CAPS: 100.0000 mg | ORAL_CAPSULE | Freq: Two times a day (BID) | ORAL | Status: DC

## 2015-08-06 NOTE — ED Notes (Addendum)
Patient complains of sore throat, cough with no productivity,hoarseness, ear pain. Patient states that she was seen in the ED on Monday and place on Prednisone and cough medication. Patient states that she is an asthmatic.

## 2015-08-06 NOTE — Discharge Instructions (Signed)
Take medication as prescribed. Rest. Drink plenty of fluids.   Follow up with your primary care physician this week. Return to Urgent care or ER for chest pain, shortness of breath, fevers, new or worsening concerns.    How to Use an Inhaler Proper inhaler technique is very important. Good technique ensures that the medicine reaches the lungs. Poor technique results in depositing the medicine on the tongue and back of the throat rather than in the airways. If you do not use the inhaler with good technique, the medicine will not help you. STEPS TO FOLLOW IF USING AN INHALER WITHOUT AN EXTENSION TUBE  Remove the cap from the inhaler.  If you are using the inhaler for the first time, you will need to prime it. Shake the inhaler for 5 seconds and release four puffs into the air, away from your face. Ask your health care provider or pharmacist if you have questions about priming your inhaler.  Shake the inhaler for 5 seconds before each breath in (inhalation).  Position the inhaler so that the top of the canister faces up.  Put your index finger on the top of the medicine canister. Your thumb supports the bottom of the inhaler.  Open your mouth.  Either place the inhaler between your teeth and place your lips tightly around the mouthpiece, or hold the inhaler 1-2 inches away from your open mouth. If you are unsure of which technique to use, ask your health care provider.  Breathe out (exhale) normally and as completely as possible.  Press the canister down with your index finger to release the medicine.  At the same time as the canister is pressed, inhale deeply and slowly until your lungs are completely filled. This should take 4-6 seconds. Keep your tongue down.  Hold the medicine in your lungs for 5-10 seconds (10 seconds is best). This helps the medicine get into the small airways of your lungs.  Breathe out slowly, through pursed lips. Whistling is an example of pursed lips.  Wait at  least 15-30 seconds between puffs. Continue with the above steps until you have taken the number of puffs your health care provider has ordered. Do not use the inhaler more than your health care provider tells you.  Replace the cap on the inhaler.  Follow the directions from your health care provider or the inhaler insert for cleaning the inhaler. STEPS TO FOLLOW IF USING AN INHALER WITH AN EXTENSION (SPACER)  Remove the cap from the inhaler.  If you are using the inhaler for the first time, you will need to prime it. Shake the inhaler for 5 seconds and release four puffs into the air, away from your face. Ask your health care provider or pharmacist if you have questions about priming your inhaler.  Shake the inhaler for 5 seconds before each breath in (inhalation).  Place the open end of the spacer onto the mouthpiece of the inhaler.  Position the inhaler so that the top of the canister faces up and the spacer mouthpiece faces you.  Put your index finger on the top of the medicine canister. Your thumb supports the bottom of the inhaler and the spacer.  Breathe out (exhale) normally and as completely as possible.  Immediately after exhaling, place the spacer between your teeth and into your mouth. Close your lips tightly around the spacer.  Press the canister down with your index finger to release the medicine.  At the same time as the canister is pressed, inhale  deeply and slowly until your lungs are completely filled. This should take 4-6 seconds. Keep your tongue down and out of the way.  Hold the medicine in your lungs for 5-10 seconds (10 seconds is best). This helps the medicine get into the small airways of your lungs. Exhale.  Repeat inhaling deeply through the spacer mouthpiece. Again hold that breath for up to 10 seconds (10 seconds is best). Exhale slowly. If it is difficult to take this second deep breath through the spacer, breathe normally several times through the spacer.  Remove the spacer from your mouth.  Wait at least 15-30 seconds between puffs. Continue with the above steps until you have taken the number of puffs your health care provider has ordered. Do not use the inhaler more than your health care provider tells you.  Remove the spacer from the inhaler, and place the cap on the inhaler.  Follow the directions from your health care provider or the inhaler insert for cleaning the inhaler and spacer. If you are using different kinds of inhalers, use your quick relief medicine to open the airways 10-15 minutes before using a steroid if instructed to do so by your health care provider. If you are unsure which inhalers to use and the order of using them, ask your health care provider, nurse, or respiratory therapist. If you are using a steroid inhaler, always rinse your mouth with water after your last puff, then gargle and spit out the water. Do not swallow the water. AVOID:  Inhaling before or after starting the spray of medicine. It takes practice to coordinate your breathing with triggering the spray.  Inhaling through the nose (rather than the mouth) when triggering the spray. HOW TO DETERMINE IF YOUR INHALER IS FULL OR NEARLY EMPTY You cannot know when an inhaler is empty by shaking it. A few inhalers are now being made with dose counters. Ask your health care provider for a prescription that has a dose counter if you feel you need that extra help. If your inhaler does not have a counter, ask your health care provider to help you determine the date you need to refill your inhaler. Write the refill date on a calendar or your inhaler canister. Refill your inhaler 7-10 days before it runs out. Be sure to keep an adequate supply of medicine. This includes making sure it is not expired, and that you have a spare inhaler.  SEEK MEDICAL CARE IF:   Your symptoms are only partially relieved with your inhaler.  You are having trouble using your inhaler.  You have  some increase in phlegm. SEEK IMMEDIATE MEDICAL CARE IF:   You feel little or no relief with your inhalers. You are still wheezing and are feeling shortness of breath or tightness in your chest or both.  You have dizziness, headaches, or a fast heart rate.  You have chills, fever, or night sweats.  You have a noticeable increase in phlegm production, or there is blood in the phlegm. MAKE SURE YOU:   Understand these instructions.  Will watch your condition.  Will get help right away if you are not doing well or get worse.   This information is not intended to replace advice given to you by your health care provider. Make sure you discuss any questions you have with your health care provider.   Document Released: 05/05/2000 Document Revised: 02/26/2013 Document Reviewed: 12/05/2012 Elsevier Interactive Patient Education  Sinusitis, Adult Sinusitis is redness, soreness, and puffiness (inflammation) of the  air pockets in the bones of your face (sinuses). The redness, soreness, and puffiness can cause air and mucus to get trapped in your sinuses. This can allow germs to grow and cause an infection.  HOME CARE   Drink enough fluids to keep your pee (urine) clear or pale yellow.  Use a humidifier in your home.  Run a hot shower to create steam in the bathroom. Sit in the bathroom with the door closed. Breathe in the steam 3-4 times a day.  Put a warm, moist washcloth on your face 3-4 times a day, or as told by your doctor.  Use salt water sprays (saline sprays) to wet the thick fluid in your nose. This can help the sinuses drain.  Only take medicine as told by your doctor. GET HELP RIGHT AWAY IF:   Your pain gets worse.  You have very bad headaches.  You are sick to your stomach (nauseous).  You throw up (vomit).  You are very sleepy (drowsy) all the time.  Your face is puffy (swollen).  Your vision changes.  You have a stiff neck.  You have trouble breathing. MAKE  SURE YOU:   Understand these instructions.  Will watch your condition.  Will get help right away if you are not doing well or get worse.   This information is not intended to replace advice given to you by your health care provider. Make sure you discuss any questions you have with your health care provider.   Document Released: 10/25/2007 Document Revised: 05/29/2014 Document Reviewed: 12/12/2011 Elsevier Interactive Patient Education 2016 [employer]. [employer]

## 2015-08-06 NOTE — ED Provider Notes (Signed)
[facility]  ____________________________________________  Time seen: Approximately 8:15 PM  I have reviewed the triage vital signs and the nursing notes.   HISTORY  Chief Complaint Sore Throat  HPI Donna Howard is a 45 y.o. female presents feeling bedside for the complaints of 1.5 weeks of runny nose, nasal congestion, cough. Patient reports some intermittent wheezing with her cough. Patient reports some occasional sore throat associated with coughing. Patient reports history of asthma which flares up frequently when she is sick. States otherwise her asthma is well controlled. Patient reports some occasional chills but denies known fever. Reports sinus pressure and sinus drainage. States frequently getting very thick greenish nasal drainage out when blowing her nose as well as occasionally greenish mucus when coughing. Also reports a hoarse voice and some bilateral ear discomfort described as an aching sensation. Denies hearing changes.  Denies chest pain, shortness of breath, chest pain with deep breath, dizziness, weakness, hearing changes, abdominal pain, dysuria, neck pain, back pain, extremity pain or extremity swelling. Denies recent sickness. Denies dysuria or vaginal complaints.  Patient reports that this past Monday she went to the emergency room for same complaints. Patient states at that time she was told that it was a virus. Patient states at that time she was given prednisone taper, albuterol inhaler, Tessalon Perles for cough, magic mouthwash, Flonase and Zyrtec. Patient reports that the wheezing has improved however reports continues with congestion, cough and nasal drainage.  Patient's last menstrual period was 08/05/2015. Denies chance of pregnancy. Patient states "absolutely no chance of pregnancy ".    Past Medical History  Diagnosis Date  . Asthma     There are no active problems to display for this patient.   Past Surgical History  Procedure Laterality  Date  . Tubal ligation      Current Outpatient Rx  Name  Route  Sig  Dispense  Refill  . albuterol (PROVENTIL HFA;VENTOLIN HFA) 108 (90 BASE) MCG/ACT inhaler   Inhalation   Inhale 2 puffs into the lungs every 6 (six) hours as needed for wheezing or shortness of breath.         . beclomethasone (QVAR) 80 MCG/ACT inhaler   Inhalation   Inhale 1 puff into the lungs 2 (two) times daily.         . benzonatate (TESSALON) 200 MG capsule   Oral   Take 1 capsule (200 mg total) by mouth 3 (three) times daily as needed for cough.   21 capsule   0   . cetirizine (ZYRTEC) 10 MG tablet   Oral   Take 1 tablet (10 mg total) by mouth daily.   30 tablet   0   . fluticasone (FLONASE) 50 MCG/ACT nasal spray   Each Nare   Place 1 spray into both nostrils 2 (two) times daily.   16 g   0   . magic mouthwash w/lidocaine SOLN   Oral   Take 5 mLs by mouth 4 (four) times daily.   240 mL   0     Dispense in a 1/1/1/1 ratio. Use lidocaine, diphen ...   . predniSONE (DELTASONE) 10 MG tablet   Oral   Take 1 tablet (10 mg total) by mouth as directed.   21 tablet   0     Take on a daily basis of 6, 5, 4, 3, 2, 1     Allergies Review of patient's allergies indicates no known allergies.  History reviewed. No pertinent family history.  Social History Social History  Substance Use Topics  . Smoking status: Current Every Day Smoker -- 1.00 packs/day for 24 years    Types: Cigarettes  . Smokeless tobacco: None  . Alcohol Use: No    Review of Systems Constitutional: No fever/chills Eyes: No visual changes. ENT: No sore throat. Cardiovascular: Denies chest pain. Respiratory: Denies shortness of breath. Gastrointestinal: No abdominal pain.  No nausea, no vomiting.  No diarrhea.  No constipation. Genitourinary: Negative for dysuria. Musculoskeletal: Negative for back pain. Skin: Negative for rash. Neurological: Negative for headaches, focal weakness or numbness.  10-point ROS  otherwise negative.  ____________________________________________   PHYSICAL EXAM:  VITAL SIGNS: ED Triage Vitals  Enc Vitals Group     BP 08/06/15 1822 127/87 mmHg     Pulse Rate 08/06/15 1822 82     Resp 08/06/15 1822 17     Temp 08/06/15 1822 98.2 F (36.8 C)     Temp Source 08/06/15 1822 Tympanic     SpO2 08/06/15 1822 99 %     Weight 08/06/15 1822 135 lb (61.236 kg)     Height 08/06/15 1822 5\' 7"  (1.702 m)     Head Cir --      Peak Flow --      Pain Score 08/06/15 1827 8     Pain Loc --      Pain Edu? --      Excl. in GC? --   Constitutional: Alert and oriented. Well appearing and in no acute distress. Eyes: Conjunctivae are normal. PERRL. EOMI. Head: Atraumatic.Mild to moderate tenderness to palpation bilateral frontal and maxillary sinuses. No swelling. No erythema.   Ears: no erythema, normal TMs bilaterally.   Nose: nasal congestion with bilateral nasal turbinate erythema and edema.   Mouth/Throat: Mucous membranes are moist.  Mild pharyngeal erythema. No tonsillar swelling or exudate.  Neck: No stridor.  No cervical spine tenderness to palpation. Hematological/Lymphatic/Immunilogical: No cervical lymphadenopathy. Cardiovascular: Normal rate, regular rhythm. Grossly normal heart sounds.  Good peripheral circulation. Respiratory: Normal respiratory effort.  No retractions. Good air movement. Mild scattered rhonchi, mild occasional inspiratory scattered wheezes. Speaking in complete sentences. Ambulatory in room while speaking in complete sentences. Dry intermittent cough noted in room.No focal area of consolidation auscultated.  Gastrointestinal: Soft and nontender. No distention. Normal Bowel sounds. No CVA tenderness. Musculoskeletal: No lower or upper extremity tenderness nor edema.  Bilateral pedal pulses equal and easily palpated. No cervical, thoracic or lumbar tenderness to palpation. No calf tenderness bilaterally. Neurologic:  Normal speech and language. No gross  focal neurologic deficits are appreciated. No gait instability. Skin:  Skin is warm, dry and intact. No rash noted. Psychiatric: Mood and affect are normal. Speech and behavior are normal.  ____________________________________________   LABS (all labs ordered are listed, but only abnormal results are displayed)  Labs Reviewed - No data to display   INITIAL IMPRESSION / ASSESSMENT AND PLAN / ED COURSE  Pertinent labs & imaging results that were available during my care of the patient were reviewed by me and considered in my medical decision making (see chart for details).  Very well-appearing patient. No acute distress. Presents for the complaints of 1.5 weeks of runny nose, nasal congestion, sinus pressure, sinus drainage, cough with intermittent wheezing. Patient reports history of asthma and states that her wheezing and asthma intermittently flares up when she is sick. Reports has continued to drink fluids well but slight decrease in appetite. Seen in emergency room a few days ago for  similar and placed on prednisone taper and supportive treatments. Patient reports prednisone and albuterol inhaler has helped with wheezing but continues with cough and sinus congestion. Denies recent antibiotic use.  Patient with frontal and maxillary sinus tenderness to palpation with nasal drainage. Patient with mild scattered rhonchi as well as mild occasional inspiratory wheezes. Suspect sinusitis and bronchitis. Patient states that she does not want a chest x-ray, flu swab or strep test performed at this time, and refused these tests. Will treat patient bronchitis and sinusitis with oral doxycycline as well as when necessary Tussionex. Patient states no chance of pregnancy. Also counseled regarding photosensitivity with antibiotics.Discussed indication, risks and benefits of medications with patient. Counseled patient to complete prednisone taper, continue home albuterol inhaler use as needed, rest, fluids,  PCP follow up.  Discussed follow up with Primary care physician this week. Discussed follow up and return parameters including no resolution or any worsening concerns. Patient verbalized understanding and agreed to plan.   ____________________________________________   FINAL CLINICAL IMPRESSION(S) / ED DIAGNOSES  Final diagnoses:  Acute maxillary sinusitis, recurrence not specified  Bronchitis      Note: This dictation was prepared with Dragon dictation along with smaller phrase technology. Any transcriptional errors that result from this process are unintentional.    Renford Dills, NP 08/12/15 0847  Renford Dills, NP 08/12/15 [id number]

## 2016-06-12 ENCOUNTER — Encounter: Payer: Self-pay | Admitting: *Deleted

## 2016-06-12 ENCOUNTER — Ambulatory Visit
Admission: EM | Admit: 2016-06-12 | Discharge: 2016-06-12 | Disposition: A | Discharge status: Home / Self Care | Payer: Self-pay | Attending: Family Medicine | Admitting: Family Medicine

## 2016-06-12 DIAGNOSIS — R110 Nausea: Secondary | ICD-10-CM

## 2016-06-12 DIAGNOSIS — R69 Illness, unspecified: Secondary | ICD-10-CM

## 2016-06-12 DIAGNOSIS — J111 Influenza due to unidentified influenza virus with other respiratory manifestations: Secondary | ICD-10-CM

## 2016-06-12 MED ORDER — ONDANSETRON 8 MG PO TBDP: 8.0000 mg | ORAL_TABLET | Freq: Three times a day (TID) | ORAL | 0 refills | Status: DC | PRN

## 2016-06-12 MED ORDER — OSELTAMIVIR PHOSPHATE 75 MG PO CAPS: 75.0000 mg | ORAL_CAPSULE | Freq: Two times a day (BID) | ORAL | 0 refills | Status: DC

## 2016-06-12 NOTE — ED Provider Notes (Signed)
[facility]    CSN: [id number] Arrival date & time: 06/12/16  1038     History   Chief Complaint Chief Complaint  Patient presents with  . Nausea  . Diarrhea  . Generalized Body Aches  . Nasal Congestion    HPI Donna Howard is a 46 y.o. female.   The history is provided by the patient.  Diarrhea  Associated symptoms: fever, myalgias and URI   URI  Presenting symptoms: congestion, cough, fatigue, fever and rhinorrhea   Severity:  Moderate Onset quality:  Sudden Duration:  2 days Timing:  Constant Progression:  Worsening Chronicity:  New Relieved by:  None tried Ineffective treatments:  None tried Associated symptoms: myalgias   Associated symptoms comment:  Nausea, watery diarrhea Risk factors: sick contacts   Risk factors: not elderly, no chronic cardiac disease, no chronic kidney disease, no chronic respiratory disease, no diabetes mellitus, no immunosuppression, no recent illness and no recent travel     Past Medical History:  Diagnosis Date  . Asthma     There are no active problems to display for this patient.   Past Surgical History:  Procedure Laterality Date  . TUBAL LIGATION      OB History    No data available       Home Medications    Prior to Admission medications   Medication Sig Start Date End Date Taking? Authorizing Provider  albuterol (PROVENTIL HFA;VENTOLIN HFA) 108 (90 BASE) MCG/ACT inhaler Inhale 2 puffs into the lungs every 6 (six) hours as needed for wheezing or shortness of breath.   Yes Historical Provider, MD  beclomethasone (QVAR) 80 MCG/ACT inhaler Inhale 1 puff into the lungs 2 (two) times daily.   Yes Historical Provider, MD  benzonatate (TESSALON) 200 MG capsule Take 1 capsule (200 mg total) by mouth 3 (three) times daily as needed for cough. 08/02/15   Delorise Royals Cuthriell, PA-C  cetirizine (ZYRTEC) 10 MG tablet Take 1 tablet (10 mg total) by mouth daily. 08/02/15   Delorise Royals Cuthriell, PA-C    chlorpheniramine-HYDROcodone (TUSSIONEX PENNKINETIC ER) 10-8 MG/5ML SUER Take 5 mLs by mouth every 12 (twelve) hours as needed for cough (do not drive or operate heavy machinery as can cause drowsiness.). 08/06/15   Renford Dills, NP  doxycycline (VIBRAMYCIN) 100 MG capsule Take 1 capsule (100 mg total) by mouth 2 (two) times daily. 08/06/15   Renford Dills, NP  fluticasone (FLONASE) 50 MCG/ACT nasal spray Place 1 spray into both nostrils 2 (two) times daily. 08/02/15   Delorise Royals Cuthriell, PA-C  magic mouthwash w/lidocaine SOLN Take 5 mLs by mouth 4 (four) times daily. 08/02/15   Delorise Royals Cuthriell, PA-C  ondansetron (ZOFRAN ODT) 8 MG disintegrating tablet Take 1 tablet (8 mg total) by mouth every 8 (eight) hours as needed. 06/12/16   Payton Mccallum, MD  oseltamivir (TAMIFLU) 75 MG capsule Take 1 capsule (75 mg total) by mouth 2 (two) times daily. 06/12/16   Payton Mccallum, MD  predniSONE (DELTASONE) 10 MG tablet Take 1 tablet (10 mg total) by mouth as directed. 08/02/15   Delorise Royals Cuthriell, PA-C    Family History History reviewed. No pertinent family history.  Social History Social History  Substance Use Topics  . Smoking status: Current Every Day Smoker    Packs/day: 1.00    Years: 24.00    Types: Cigarettes  . Smokeless tobacco: Never Used  . Alcohol use No     Allergies   Patient has no known  allergies.   Review of Systems Review of Systems  Constitutional: Positive for fatigue and fever.  HENT: Positive for congestion and rhinorrhea.   Respiratory: Positive for cough.   Gastrointestinal: Positive for diarrhea.  Musculoskeletal: Positive for myalgias.     Physical Exam Triage Vital Signs ED Triage Vitals  Enc Vitals Group     BP 06/12/16 1057 (!) 136/95     Pulse Rate 06/12/16 1057 96     Resp 06/12/16 1057 18     Temp 06/12/16 1057 98.6 F (37 C)     Temp Source 06/12/16 1057 Oral     SpO2 06/12/16 1057 100 %     Weight 06/12/16 1058 130 lb (59 kg)     Height  06/12/16 1058 5\' 7"  (1.702 m)     Head Circumference --      Peak Flow --      Pain Score 06/12/16 1103 10     Pain Loc --      Pain Edu? --      Excl. in GC? --    No data found.   Updated Vital Signs BP (!) 136/95 (BP Location: Left Arm)   Pulse 96   Temp 98.6 F (37 C) (Oral)   Resp 18   Ht 5\' 7"  (1.702 m)   Wt 130 lb (59 kg)   LMP 05/29/2016   SpO2 100%   BMI 20.36 kg/m   Visual Acuity Right Eye Distance:   Left Eye Distance:   Bilateral Distance:    Right Eye Near:   Left Eye Near:    Bilateral Near:     Physical Exam  Constitutional: She is oriented to person, place, and time. She appears well-developed and well-nourished. No distress.  HENT:  Head: Normocephalic.  Right Ear: Tympanic membrane, external ear and ear canal normal.  Left Ear: Tympanic membrane, external ear and ear canal normal.  Nose: Rhinorrhea present.  Mouth/Throat: Oropharynx is clear and moist and mucous membranes are normal.  Eyes: Conjunctivae and EOM are normal. Pupils are equal, round, and reactive to light. Right eye exhibits no discharge. Left eye exhibits no discharge. No scleral icterus.  Neck: Normal range of motion. Neck supple. No JVD present. No tracheal deviation present. No thyromegaly present.  Cardiovascular: Normal rate, regular rhythm, normal heart sounds and intact distal pulses.   No murmur heard. Pulmonary/Chest: Effort normal and breath sounds normal. No stridor. No respiratory distress. She has no wheezes. She has no rales. She exhibits no tenderness.  Abdominal: Soft. Bowel sounds are normal. She exhibits no distension and no mass. There is no tenderness. There is no rebound and no guarding.  Musculoskeletal: She exhibits no edema or tenderness.  Lymphadenopathy:    She has no cervical adenopathy.  Neurological: She is alert and oriented to person, place, and time. She has normal reflexes.  Skin: Skin is warm. No rash noted. She is not diaphoretic.  Vitals  reviewed.    UC Treatments / Results  Labs (all labs ordered are listed, but only abnormal results are displayed) Labs Reviewed - No data to display  EKG  EKG Interpretation None       Radiology No results found.  Procedures Procedures (including critical care time)  Medications Ordered in UC Medications - No data to display   Initial Impression / Assessment and Plan / UC Course  I have reviewed the triage vital signs and the nursing notes.  Pertinent labs & imaging results that were available during my  care of the patient were reviewed by me and considered in my medical decision making (see chart for details).      Final Clinical Impressions(s) / UC Diagnoses   Final diagnoses:  Influenza-like illness  Nausea    New Prescriptions Discharge Medication List as of 06/12/2016 11:52 AM    START taking these medications   Details  ondansetron (ZOFRAN ODT) 8 MG disintegrating tablet Take 1 tablet (8 mg total) by mouth every 8 (eight) hours as needed., Starting Mon 06/12/2016, Normal    oseltamivir (TAMIFLU) 75 MG capsule Take 1 capsule (75 mg total) by mouth 2 (two) times daily., Starting Mon 06/12/2016, Normal       1. diagnosis reviewed with patient 2. rx as per orders above; reviewed possible side effects, interactions, risks and benefits  3. Recommend supportive treatment with increased fluids/clear liquids, then advance slowly as tolerated 4. Follow-up prn if symptoms worsen or don't improve   Payton Mccallum, MD 06/12/16 1209

## 2016-06-12 NOTE — ED Triage Notes (Signed)
Patient started having symptoms of nasal congestion, body aches, nausea and diarrhea yesterday.

## 2016-07-22 ENCOUNTER — Emergency Department
Admission: EM | Admit: 2016-07-22 | Discharge: 2016-07-22 | Disposition: A | Discharge status: Home / Self Care | Payer: Self-pay | Attending: Emergency Medicine | Admitting: Emergency Medicine

## 2016-07-22 DIAGNOSIS — J45909 Unspecified asthma, uncomplicated: Secondary | ICD-10-CM | POA: Insufficient documentation

## 2016-07-22 DIAGNOSIS — J0100 Acute maxillary sinusitis, unspecified: Secondary | ICD-10-CM | POA: Insufficient documentation

## 2016-07-22 DIAGNOSIS — F17210 Nicotine dependence, cigarettes, uncomplicated: Secondary | ICD-10-CM | POA: Insufficient documentation

## 2016-07-22 DIAGNOSIS — Z79899 Other long term (current) drug therapy: Secondary | ICD-10-CM | POA: Insufficient documentation

## 2016-07-22 MED ORDER — KETOROLAC TROMETHAMINE 30 MG/ML IJ SOLN
INTRAMUSCULAR | Status: AC
  Administered 2016-07-22: 30 mg via INTRAMUSCULAR
  Filled 2016-07-22: qty 1

## 2016-07-22 MED ORDER — HYDROCODONE-ACETAMINOPHEN 5-325 MG PO TABS
ORAL_TABLET | ORAL | Status: AC
  Filled 2016-07-22: qty 1

## 2016-07-22 MED ORDER — AMOXICILLIN-POT CLAVULANATE 875-125 MG PO TABS
1.0000 | ORAL_TABLET | Freq: Once | ORAL | Status: AC
  Administered 2016-07-22: 1 via ORAL

## 2016-07-22 MED ORDER — AMOXICILLIN-POT CLAVULANATE 875-125 MG PO TABS
ORAL_TABLET | ORAL | Status: AC
  Administered 2016-07-22: 1 via ORAL
  Filled 2016-07-22: qty 1

## 2016-07-22 MED ORDER — KETOROLAC TROMETHAMINE 60 MG/2ML IM SOLN
30.0000 mg | Freq: Once | INTRAMUSCULAR | Status: AC
  Administered 2016-07-22: 30 mg via INTRAMUSCULAR

## 2016-07-22 MED ORDER — HYDROCODONE-ACETAMINOPHEN 5-325 MG PO TABS
1.0000 | ORAL_TABLET | ORAL | Status: AC
  Administered 2016-07-22: 1 via ORAL

## 2016-07-22 MED ORDER — AMOXICILLIN-POT CLAVULANATE 875-125 MG PO TABS: 1.0000 | ORAL_TABLET | Freq: Two times a day (BID) | ORAL | 0 refills | Status: AC

## 2016-07-22 NOTE — ED Provider Notes (Signed)
[facility]-EMERGENCY DEPARTMENT Provider Note   CSN: [id number] Arrival date & time: 07/22/16  2023     History   Chief Complaint Chief Complaint  Patient presents with  . Facial Pain    HPI Donna Howard is a 46 y.o. female presents to the emergency department for evaluation of one month of sinus pain and pressure and drainage. Over the last few days pain has become severe along the left maxillary greater than frontal sinus. She took antibiotics from a friend, unknown name and had improvement for a couple of days. Pain quickly returned after 2 days of antibiotics. She's taken Tylenol and Advil with minimal improvement. She denies any fevers, neck pain, vision changes, numbness tingling or radicular symptoms. No dizziness.  HPI  Past Medical History:  Diagnosis Date  . Asthma     There are no active problems to display for this patient.   Past Surgical History:  Procedure Laterality Date  . TUBAL LIGATION      OB History    No data available       Home Medications    Prior to Admission medications   Medication Sig Start Date End Date Taking? Authorizing Provider  albuterol (PROVENTIL HFA;VENTOLIN HFA) 108 (90 BASE) MCG/ACT inhaler Inhale 2 puffs into the lungs every 6 (six) hours as needed for wheezing or shortness of breath.    Historical Provider, MD  amoxicillin-clavulanate (AUGMENTIN) 875-125 MG tablet Take 1 tablet by mouth every 12 (twelve) hours. 07/22/16 08/01/16  Evon Slack, PA-C  beclomethasone (QVAR) 80 MCG/ACT inhaler Inhale 1 puff into the lungs 2 (two) times daily.    Historical Provider, MD  benzonatate (TESSALON) 200 MG capsule Take 1 capsule (200 mg total) by mouth 3 (three) times daily as needed for cough. 08/02/15   Delorise Royals Cuthriell, PA-C  cetirizine (ZYRTEC) 10 MG tablet Take 1 tablet (10 mg total) by mouth daily. 08/02/15   Delorise Royals Cuthriell, PA-C  chlorpheniramine-HYDROcodone (TUSSIONEX PENNKINETIC ER) 10-8 MG/5ML SUER Take 5 mLs by mouth every  12 (twelve) hours as needed for cough (do not drive or operate heavy machinery as can cause drowsiness.). 08/06/15   Renford Dills, NP  doxycycline (VIBRAMYCIN) 100 MG capsule Take 1 capsule (100 mg total) by mouth 2 (two) times daily. 08/06/15   Renford Dills, NP  fluticasone (FLONASE) 50 MCG/ACT nasal spray Place 1 spray into both nostrils 2 (two) times daily. 08/02/15   Delorise Royals Cuthriell, PA-C  magic mouthwash w/lidocaine SOLN Take 5 mLs by mouth 4 (four) times daily. 08/02/15   Delorise Royals Cuthriell, PA-C  ondansetron (ZOFRAN ODT) 8 MG disintegrating tablet Take 1 tablet (8 mg total) by mouth every 8 (eight) hours as needed. 06/12/16   Payton Mccallum, MD  oseltamivir (TAMIFLU) 75 MG capsule Take 1 capsule (75 mg total) by mouth 2 (two) times daily. 06/12/16   Payton Mccallum, MD  predniSONE (DELTASONE) 10 MG tablet Take 1 tablet (10 mg total) by mouth as directed. 08/02/15   Delorise Royals Cuthriell, PA-C    Family History No family history on file.  Social History Social History  Substance Use Topics  . Smoking status: Current Every Day Smoker    Packs/day: 1.00    Years: 24.00    Types: Cigarettes  . Smokeless tobacco: Never Used  . Alcohol use No     Allergies   Patient has no known allergies.   Review of Systems Review of Systems  Constitutional: Negative for activity change, chills, fatigue and fever.  HENT: Positive for sinus pain and sinus pressure. Negative for congestion and sore throat.   Eyes: Negative for visual disturbance.  Respiratory: Negative for cough, chest tightness and shortness of breath.   Cardiovascular: Negative for chest pain and leg swelling.  Gastrointestinal: Negative for abdominal pain, diarrhea, nausea and vomiting.  Genitourinary: Negative for dysuria.  Musculoskeletal: Negative for arthralgias and gait problem.  Skin: Negative for rash.  Neurological: Negative for weakness, numbness and headaches.  Hematological: Negative for adenopathy.    Psychiatric/Behavioral: Negative for agitation, behavioral problems and confusion.     Physical Exam Updated Vital Signs BP 120/88   Pulse 61   Temp 99 F (37.2 C) (Oral)   Resp 20   Ht 5\' 7"  (1.702 m)   Wt 54.4 kg   LMP 06/25/2016   SpO2 100%   BMI 18.79 kg/m   Physical Exam  Constitutional: She appears well-developed and well-nourished. No distress.  HENT:  Head: Normocephalic and atraumatic.  Right Ear: External ear normal.  Left Ear: External ear normal.  Nose: Nose normal.  Mouth/Throat: Oropharynx is clear and moist. No oropharyngeal exudate.  Significant tenderness along the left frontal and maxillary sinus. She has mild right maxillary sinus tenderness. She has no numbness or sensation discrepancy throughout the face.  Eyes: Conjunctivae and EOM are normal. Pupils are equal, round, and reactive to light. Right eye exhibits no discharge. Left eye exhibits no discharge.  Neck: Neck supple.  Cardiovascular: Normal rate and regular rhythm.   No murmur heard. Pulmonary/Chest: Effort normal and breath sounds normal. No respiratory distress.  Abdominal: Soft. There is no tenderness.  Musculoskeletal: She exhibits no edema.  Neurological: She is alert. No cranial nerve deficit. Coordination normal.  Skin: Skin is warm and dry.  Psychiatric: She has a normal mood and affect.  Nursing note and vitals reviewed.    ED Treatments / Results  Labs (all labs ordered are listed, but only abnormal results are displayed) Labs Reviewed - No data to display  EKG  EKG Interpretation None       Radiology No results found.  Procedures Procedures (including critical care time)  Medications Ordered in ED Medications  ketorolac (TORADOL) injection 30 mg (30 mg Intramuscular Given 07/22/16 2056)  amoxicillin-clavulanate (AUGMENTIN) 875-125 MG per tablet 1 tablet (1 tablet Oral Given 07/22/16 2056)     Initial Impression / Assessment and Plan / ED Course  I have reviewed  the triage vital signs and the nursing notes.  Pertinent labs & imaging results that were available during my care of the patient were reviewed by me and considered in my medical decision making (see chart for details).     46 year old female with sinusitis. She is started on Augmentin. She is educated on signs and symptoms return to the ED for.  Final Clinical Impressions(s) / ED Diagnoses   Final diagnoses:  Acute maxillary sinusitis, recurrence not specified    New Prescriptions New Prescriptions   AMOXICILLIN-CLAVULANATE (AUGMENTIN) 875-125 MG TABLET    Take 1 tablet by mouth every 12 (twelve) hours.     Evon Slackhomas C June Vacha, PA-C 07/22/16 2124    Phineas SemenGraydon Goodman, MD 07/22/16 2245

## 2016-07-22 NOTE — Discharge Instructions (Signed)
Please take antibiotics as prescribed. Return to the ER for any fevers worsening symptoms or urgent changes in her health.

## 2016-07-22 NOTE — ED Triage Notes (Signed)
Pt states left sided upper facial pain with green nasal drainage for over a week. Pt arrives to triage via wheelchair, tearful with rapid speech. Pt states she took tylenol at 1930 extra strength, advil at this pm, unknown time. Pt with pwd skin.

## 2018-05-11 ENCOUNTER — Encounter: Payer: Self-pay | Admitting: Emergency Medicine

## 2018-05-11 ENCOUNTER — Emergency Department: Payer: Self-pay

## 2018-05-11 ENCOUNTER — Other Ambulatory Visit: Payer: Self-pay

## 2018-05-11 ENCOUNTER — Emergency Department
Admission: EM | Admit: 2018-05-11 | Discharge: 2018-05-11 | Disposition: A | Discharge status: Home / Self Care | Payer: Self-pay | Attending: Emergency Medicine | Admitting: Emergency Medicine

## 2018-05-11 DIAGNOSIS — Z79899 Other long term (current) drug therapy: Secondary | ICD-10-CM | POA: Insufficient documentation

## 2018-05-11 DIAGNOSIS — F17210 Nicotine dependence, cigarettes, uncomplicated: Secondary | ICD-10-CM | POA: Insufficient documentation

## 2018-05-11 DIAGNOSIS — J181 Lobar pneumonia, unspecified organism: Secondary | ICD-10-CM | POA: Insufficient documentation

## 2018-05-11 DIAGNOSIS — J45901 Unspecified asthma with (acute) exacerbation: Secondary | ICD-10-CM | POA: Insufficient documentation

## 2018-05-11 LAB — COMPREHENSIVE METABOLIC PANEL
ALT: 40 U/L (ref 0–44)
ANION GAP: 8 (ref 5–15)
AST: 35 U/L (ref 15–41)
Albumin: 3.2 g/dL — ABNORMAL LOW (ref 3.5–5.0)
Alkaline Phosphatase: 62 U/L (ref 38–126)
BILIRUBIN TOTAL: 0.7 mg/dL (ref 0.3–1.2)
BUN: 6 mg/dL (ref 6–20)
CO2: 22 mmol/L (ref 22–32)
Calcium: 8.1 mg/dL — ABNORMAL LOW (ref 8.9–10.3)
Chloride: 105 mmol/L (ref 98–111)
Creatinine, Ser: 0.7 mg/dL (ref 0.44–1.00)
GFR calc Af Amer: >60 mL/min (ref >60)
Glucose, Bld: 120 mg/dL — ABNORMAL HIGH (ref 70–99)
POTASSIUM: 3.1 mmol/L — AB (ref 3.5–5.1)
Sodium: 135 mmol/L (ref 135–145)
TOTAL PROTEIN: 6.5 g/dL (ref 6.5–8.1)

## 2018-05-11 LAB — CBC WITH DIFFERENTIAL/PLATELET
ABS IMMATURE GRANULOCYTES: 0.06 10*3/uL (ref 0.00–0.07)
BASOS PCT: 1 %
Basophils Absolute: 0.1 10*3/uL (ref 0.0–0.1)
Eosinophils Absolute: 0.1 10*3/uL (ref 0.0–0.5)
Eosinophils Relative: 1 %
HCT: 34.5 % — ABNORMAL LOW (ref 36.0–46.0)
Hemoglobin: 12.1 g/dL (ref 12.0–15.0)
IMMATURE GRANULOCYTES: 1 %
Lymphocytes Relative: 11 %
Lymphs Abs: 1.3 10*3/uL (ref 0.7–4.0)
MCH: 32 pg (ref 26.0–34.0)
MCHC: 35.1 g/dL (ref 30.0–36.0)
MCV: 91.3 fL (ref 80.0–100.0)
MONO ABS: 0.9 10*3/uL (ref 0.1–1.0)
Monocytes Relative: 8 %
NEUTROS ABS: 9.7 10*3/uL — AB (ref 1.7–7.7)
NEUTROS PCT: 78 %
PLATELETS: 357 10*3/uL (ref 150–400)
RBC: 3.78 MIL/uL — AB (ref 3.87–5.11)
RDW: 13.2 % (ref 11.5–15.5)
WBC: 12.1 10*3/uL — AB (ref 4.0–10.5)
nRBC: 0 % (ref 0.0–0.2)

## 2018-05-11 MED ORDER — PREDNISONE 50 MG PO TABS: 50.0000 mg | ORAL_TABLET | Freq: Every day | ORAL | 0 refills | Status: AC

## 2018-05-11 MED ORDER — ACETAMINOPHEN 500 MG PO TABS
1000.0000 mg | ORAL_TABLET | Freq: Once | ORAL | Status: AC
  Administered 2018-05-11: 1000 mg via ORAL
  Filled 2018-05-11: qty 2

## 2018-05-11 MED ORDER — OXYMETAZOLINE HCL 0.05 % NA SOLN
1.0000 | Freq: Once | NASAL | Status: AC
  Administered 2018-05-11: 1 via NASAL
  Filled 2018-05-11: qty 15

## 2018-05-11 MED ORDER — AMOXICILLIN 500 MG PO TABS: 1000.0000 mg | ORAL_TABLET | Freq: Three times a day (TID) | ORAL | 0 refills | Status: AC

## 2018-05-11 MED ORDER — IPRATROPIUM-ALBUTEROL 0.5-2.5 (3) MG/3ML IN SOLN
3.0000 mL | Freq: Once | RESPIRATORY_TRACT | Status: AC
  Administered 2018-05-11: 3 mL via RESPIRATORY_TRACT
  Filled 2018-05-11: qty 3

## 2018-05-11 MED ORDER — BENZONATATE 200 MG PO CAPS: 200.0000 mg | ORAL_CAPSULE | Freq: Four times a day (QID) | ORAL | 0 refills | Status: DC | PRN

## 2018-05-11 MED ORDER — SODIUM CHLORIDE 0.9 % IV BOLUS
1000.0000 mL | Freq: Once | INTRAVENOUS | Status: AC
  Administered 2018-05-11: 1000 mL via INTRAVENOUS

## 2018-05-11 MED ORDER — AZITHROMYCIN 500 MG PO TABS
500.0000 mg | ORAL_TABLET | Freq: Once | ORAL | Status: AC
  Administered 2018-05-11: 500 mg via ORAL
  Filled 2018-05-11: qty 1

## 2018-05-11 MED ORDER — PSEUDOEPHEDRINE HCL 30 MG PO TABS
60.0000 mg | ORAL_TABLET | Freq: Once | ORAL | Status: AC
  Administered 2018-05-11: 60 mg via ORAL
  Filled 2018-05-11: qty 2

## 2018-05-11 MED ORDER — LIDOCAINE HCL (PF) 1 % IJ SOLN
5.0000 mL | Freq: Once | INTRAMUSCULAR | Status: AC
Start: 2018-05-11 — End: 2018-05-11
  Administered 2018-05-11: 5 mL via INTRADERMAL
  Filled 2018-05-11: qty 5

## 2018-05-11 MED ORDER — KETOROLAC TROMETHAMINE 30 MG/ML IJ SOLN
15.0000 mg | Freq: Once | INTRAMUSCULAR | Status: AC
  Administered 2018-05-11: 15 mg via INTRAVENOUS
  Filled 2018-05-11: qty 1

## 2018-05-11 MED ORDER — AZITHROMYCIN 250 MG PO TABS: ORAL_TABLET | ORAL | 0 refills | Status: DC

## 2018-05-11 MED ORDER — SODIUM CHLORIDE 0.9 % IV SOLN
1.0000 g | Freq: Once | INTRAVENOUS | Status: AC
  Administered 2018-05-11: 1 g via INTRAVENOUS
  Filled 2018-05-11: qty 10

## 2018-05-11 MED ORDER — METHYLPREDNISOLONE SODIUM SUCC 125 MG IJ SOLR
125.0000 mg | Freq: Once | INTRAMUSCULAR | Status: AC
  Administered 2018-05-11: 125 mg via INTRAVENOUS
  Filled 2018-05-11: qty 2

## 2018-05-11 NOTE — Discharge Instructions (Signed)
Please take both of your antibiotics as prescribed and stop taking the doxycycline that you've been taking at home.  Use your cough medication as needed for severe symptoms and make sure you do sinus rinses every day to help flush out your sinuses and prevent cough and swelling.  Follow up with your PMD in 3 days as needed for a recheck and return to the ED sooner for any concerns.  It was a pleasure to take care of you today, and thank you for coming to our emergency department.  If you have any questions or concerns before leaving please ask the nurse to grab me and I'm more than happy to go through your aftercare instructions again.  If you have any concerns once you are home that you are not improving or are in fact getting worse before you can make it to your follow-up appointment, please do not hesitate to call [phone] and come back for further evaluation.  Merrily BrittleNeil Maurie Olesen, MD  No results found for this or any previous visit. Dg Chest 2 View  Result Date: 05/11/2018 CLINICAL DATA:  Cough and chest pain for a week. EXAM: CHEST - 2 VIEW COMPARISON:  Chest radiograph January 29, 2015 FINDINGS: Cardiomediastinal silhouette is normal. Mild hyperinflation with flattened hemidiaphragms. RIGHT lower lobe patchy airspace opacity. No pleural effusion. No pneumothorax. Soft tissue planes and included osseous structures are non suspicious. IMPRESSION: 1. RIGHT lower lobe pneumonia. Followup PA and lateral chest X-ray is recommended in 3-4 weeks following trial of antibiotic therapy to ensure resolution and exclude underlying malignancy. 2. Similar hyperinflation/COPD. Electronically Signed   By: Awilda Metroourtnay  Bloomer M.D.   On: 05/11/2018 05:54

## 2018-05-11 NOTE — ED Notes (Signed)
ED Provider at bedside. 

## 2018-05-11 NOTE — ED Triage Notes (Addendum)
Patient ambulatory to triage with complaints of dry cough and sore throat for the last 7 days. Pain in back and ribs with coughing. Pt reports chills/legs aching/sweating/fever of 102, poor appetite poor sleep.  Pt reports lime green discharge from nasal passage with swelling around eyes and tenderness.  Pt reports taking Theraflu, BC sinus, loratadine, albuterol inhalers and old doxycycline at home for the last 3 days.   Speaking in complete coherent sentences. Pt sounds like upper respiratory congestion..Marland Kitchen

## 2018-05-11 NOTE — ED Provider Notes (Signed)
[facility] Emergency Department Provider Note  ____________________________________________   None    (approximate)  I have reviewed the triage vital signs and the nursing notes.   HISTORY  Chief Complaint Cough and Sore Throat   HPI Donna Howard is a 47 y.o. female who self presents to the emergency department with roughly 1 week of fever, chills, sore throat, and cough.  She has a past medical history of asthma and has been using her inhaler at home with minimal relief.  Her symptoms have been slowly progressive are now moderate to severe.  Yesterday she began taking old doxycycline that she had leftover with no effect.  She has been using over-the-counter medications with essentially no relief.  She became concerned because this morning she had worsening fatigue and she felt short of breath when walking even a short distance and she woke up sweating so she decided to come in for evaluation.  She did not get a flu shot this year.  She has no sick contacts.  She reports significant facial fullness as well as congestion.  Her symptoms are now moderate severity worse with exertion and improved with rest.  She did measure a temperature of 102 degrees yesterday.  She is finding it difficult to sleep.    Past Medical History:  Diagnosis Date  . Asthma     There are no active problems to display for this patient.   Past Surgical History:  Procedure Laterality Date  . TUBAL LIGATION      Prior to Admission medications   Medication Sig Start Date End Date Taking? Authorizing Provider  albuterol (PROVENTIL HFA;VENTOLIN HFA) 108 (90 BASE) MCG/ACT inhaler Inhale 2 puffs into the lungs every 6 (six) hours as needed for wheezing or shortness of breath.    [provider]  amoxicillin (AMOXIL) 500 MG tablet Take 2 tablets (1,000 mg total) by mouth 3 (three) times daily for 7 days. 05/11/18 05/18/18  Merrily Brittleifenbark, Omie Ferger, MD  azithromycin (ZITHROMAX Z-PAK) 250  MG tablet Take 2 tablets (500 mg) on  Day 1,  followed by 1 tablet (250 mg) once daily on Days 2 through 5. 05/11/18   Merrily Brittleifenbark, Jeremy Ditullio, MD  beclomethasone (QVAR) 80 MCG/ACT inhaler Inhale 1 puff into the lungs 2 (two) times daily.    [provider]  benzonatate (TESSALON) 200 MG capsule Take 1 capsule (200 mg total) by mouth every 6 (six) hours as needed for cough. 05/11/18 05/11/19  Merrily Brittleifenbark, Tierrah Anastos, MD  cetirizine (ZYRTEC) 10 MG tablet Take 1 tablet (10 mg total) by mouth daily. 08/02/15   Cuthriell, Delorise RoyalsJonathan D, PA-C  chlorpheniramine-HYDROcodone (TUSSIONEX PENNKINETIC ER) 10-8 MG/5ML SUER Take 5 mLs by mouth every 12 (twelve) hours as needed for cough (do not drive or operate heavy machinery as can cause drowsiness.). 08/06/15   Renford DillsMiller, Lindsey, NP  doxycycline (VIBRAMYCIN) 100 MG capsule Take 1 capsule (100 mg total) by mouth 2 (two) times daily. 08/06/15   Renford DillsMiller, Lindsey, NP  fluticasone (FLONASE) 50 MCG/ACT nasal spray Place 1 spray into both nostrils 2 (two) times daily. 08/02/15   Cuthriell, Delorise RoyalsJonathan D, PA-C  magic mouthwash w/lidocaine SOLN Take 5 mLs by mouth 4 (four) times daily. 08/02/15   Cuthriell, Delorise RoyalsJonathan D, PA-C  ondansetron (ZOFRAN ODT) 8 MG disintegrating tablet Take 1 tablet (8 mg total) by mouth every 8 (eight) hours as needed. 06/12/16   Payton Mccallumonty, [place], MD  oseltamivir (TAMIFLU) 75 MG capsule Take 1 capsule (75 mg total) by mouth 2 (two)  times daily. 06/12/16   Payton Mccallum, MD  predniSONE (DELTASONE) 50 MG tablet Take 1 tablet (50 mg total) by mouth daily for 4 days. 05/11/18 05/15/18  Merrily Brittle, MD    Allergies Patient has no known allergies.  History reviewed. No pertinent family history.  Social History Social History   Tobacco Use  . Smoking status: Current Every Day Smoker    Packs/day: 0.50    Years: 24.00    Pack years: 12.00    Types: Cigarettes  . Smokeless tobacco: Never Used  Substance Use Topics  . Alcohol use: No    Alcohol/week: 0.0  standard drinks  . Drug use: No    Review of Systems Constitutional: Positive for fevers and chills Eyes: No visual changes. ENT: Positive for sore throat. Cardiovascular: Positive for chest pain. Respiratory: Positive for shortness of breath. Gastrointestinal: No abdominal pain.  No nausea, no vomiting.  No diarrhea.  No constipation. Genitourinary: Negative for dysuria. Musculoskeletal: Negative for back pain. Skin: Negative for rash. Neurological: Negative for headaches, focal weakness or numbness.   ____________________________________________   PHYSICAL EXAM:  VITAL SIGNS: ED Triage Vitals  Enc Vitals Group     BP 05/11/18 0526 114/67     Pulse Rate 05/11/18 0526 (!) 101     Resp 05/11/18 0526 20     Temp 05/11/18 0526 98.9 F (37.2 C)     Temp Source 05/11/18 0526 Oral     SpO2 05/11/18 0526 92 %     Weight 05/11/18 0527 124 lb (56.2 kg)     Height 05/11/18 0527 5\' 7"  (1.702 m)     Head Circumference --      Peak Flow --      Pain Score 05/11/18 0526 8     Pain Loc --      Pain Edu? --      Excl. in GC? --     Constitutional: Alert and oriented x4 appears somewhat uncomfortable with elevated respiratory rate Eyes: PERRL EOMI. Head: Atraumatic. Nose: Significant congestion with right frontal sinus tenderness although no purulent discharge Mouth/Throat: No trismus Neck: No stridor.  Uvula midline no pharyngeal erythema or exudate.  No lymphadenopathy Cardiovascular: Tachycardic rate, regular rhythm. Grossly normal heart sounds.  Good peripheral circulation. Respiratory: Slightly increased respiratory effort with rhonchi in the right lower lobe although moving good air.  Mild expiratory wheeze with prolonged expiratory phase throughout Gastrointestinal: Soft nontender Musculoskeletal: No lower extremity edema   Neurologic:  Normal speech and language. No gross focal neurologic deficits are appreciated. Skin:  Skin is warm, dry and intact. No rash  noted. Psychiatric: Mood and affect are normal. Speech and behavior are normal.    ____________________________________________   DIFFERENTIAL includes but not limited to  Influenza, viral syndrome, pneumonia, pneumothorax, asthma exacerbation ____________________________________________   LABS (all labs ordered are listed, but only abnormal results are displayed)  Labs Reviewed  CBC WITH DIFFERENTIAL/PLATELET - Abnormal; Notable for the following components:      Result Value   WBC 12.1 (*)    RBC 3.78 (*)    HCT 34.5 (*)    Neutro Abs 9.7 (*)    All other components within normal limits  COMPREHENSIVE METABOLIC PANEL    Lab work reviewed by me with slightly elevated white count which is nonspecific and could be secondary to pain versus infection __________________________________________  EKG   ____________________________________________  RADIOLOGY  Chest x-ray reviewed by me shows right lower lobe pneumonia ____________________________________________   PROCEDURES  Procedure(s) performed: no  Procedures  Critical Care performed: no  ____________________________________________   INITIAL IMPRESSION / ASSESSMENT AND PLAN / ED COURSE  Pertinent labs & imaging results that were available during my care of the patient were reviewed by me and considered in my medical decision making (see chart for details).   As part of my medical decision making, I reviewed the following data within the electronic MEDICAL RECORD NUMBER History obtained from family if available, nursing notes, old chart and ekg, as well as notes from prior ED visits.  The patient comes to the emergency department with one week of worsening shortness of breath, fever, and productive cough.  CXR obtained shows right lower lobe pneumonia - community acquired.  She's saturating in the mid 90s on room air, but likely related to reactive airway disease.  We'll give her 1gm of ceftriaxone, 500mg  of  azithromycin, 3 duonebs, 125mg  of solumedrol, afrin, sudafed, toradol, tylenol, a liter of fluids, and nebulize lidocaine for cough.  I do anticipate outpatient management.  ----------------------------------------- 6:54 AM on 05/11/2018 -----------------------------------------  The patient feels significantly improved.  Lab work is reassuring.  I'll discharge her with 1000mg  of amoxicillin three times a day for a week for strep pneumo coverage as well as azithromycin for atypical coverage.  She verbalizes understanding and agreement with the plan.      ____________________________________________   FINAL CLINICAL IMPRESSION(S) / ED DIAGNOSES  Final diagnoses:  Lobar pneumonia (HCC)  Moderate asthma with exacerbation, unspecified whether persistent      NEW MEDICATIONS STARTED DURING THIS VISIT:  New Prescriptions   AMOXICILLIN (AMOXIL) 500 MG TABLET    Take 2 tablets (1,000 mg total) by mouth 3 (three) times daily for 7 days.   AZITHROMYCIN (ZITHROMAX Z-PAK) 250 MG TABLET    Take 2 tablets (500 mg) on  Day 1,  followed by 1 tablet (250 mg) once daily on Days 2 through 5.   BENZONATATE (TESSALON) 200 MG CAPSULE    Take 1 capsule (200 mg total) by mouth every 6 (six) hours as needed for cough.   PREDNISONE (DELTASONE) 50 MG TABLET    Take 1 tablet (50 mg total) by mouth daily for 4 days.     Note:  This document was prepared using Dragon voice recognition software and may include unintentional dictation errors.    Merrily Brittleifenbark, Rylan Bernard, MD 05/11/18 [id number]

## 2018-05-11 NOTE — ED Notes (Signed)
Patient c/o cough X 1 week. Patient reports chest wall pain with cough.

## 2018-07-10 ENCOUNTER — Other Ambulatory Visit: Payer: Self-pay

## 2018-07-10 ENCOUNTER — Emergency Department: Payer: Self-pay

## 2018-07-10 ENCOUNTER — Emergency Department
Admission: EM | Admit: 2018-07-10 | Discharge: 2018-07-10 | Disposition: A | Discharge status: Home / Self Care | Payer: Self-pay | Attending: Emergency Medicine | Admitting: Emergency Medicine

## 2018-07-10 ENCOUNTER — Encounter: Payer: Self-pay | Admitting: *Deleted

## 2018-07-10 DIAGNOSIS — Z720 Tobacco use: Secondary | ICD-10-CM

## 2018-07-10 DIAGNOSIS — J069 Acute upper respiratory infection, unspecified: Secondary | ICD-10-CM | POA: Insufficient documentation

## 2018-07-10 DIAGNOSIS — R220 Localized swelling, mass and lump, head: Secondary | ICD-10-CM | POA: Insufficient documentation

## 2018-07-10 DIAGNOSIS — G8929 Other chronic pain: Secondary | ICD-10-CM | POA: Insufficient documentation

## 2018-07-10 DIAGNOSIS — M545 Low back pain, unspecified: Secondary | ICD-10-CM

## 2018-07-10 DIAGNOSIS — F17210 Nicotine dependence, cigarettes, uncomplicated: Secondary | ICD-10-CM | POA: Insufficient documentation

## 2018-07-10 DIAGNOSIS — J45909 Unspecified asthma, uncomplicated: Secondary | ICD-10-CM | POA: Insufficient documentation

## 2018-07-10 DIAGNOSIS — J441 Chronic obstructive pulmonary disease with (acute) exacerbation: Secondary | ICD-10-CM | POA: Insufficient documentation

## 2018-07-10 LAB — COMPREHENSIVE METABOLIC PANEL
ALBUMIN: 4 g/dL (ref 3.5–5.0)
ALT: 17 U/L (ref 0–44)
AST: 22 U/L (ref 15–41)
Alkaline Phosphatase: 56 U/L (ref 38–126)
Anion gap: 10 (ref 5–15)
BILIRUBIN TOTAL: 0.4 mg/dL (ref 0.3–1.2)
CALCIUM: 8.7 mg/dL — AB (ref 8.9–10.3)
CO2: 21 mmol/L — ABNORMAL LOW (ref 22–32)
CREATININE: 0.63 mg/dL (ref 0.44–1.00)
Chloride: 106 mmol/L (ref 98–111)
GFR calc Af Amer: >60 mL/min (ref >60)
GFR calc non Af Amer: >60 mL/min (ref >60)
GLUCOSE: 116 mg/dL — AB (ref 70–99)
Potassium: 3.3 mmol/L — ABNORMAL LOW (ref 3.5–5.1)
Sodium: 137 mmol/L (ref 135–145)
TOTAL PROTEIN: 7.3 g/dL (ref 6.5–8.1)

## 2018-07-10 LAB — CBC
HCT: 42.4 % (ref 36.0–46.0)
HEMOGLOBIN: 14.7 g/dL (ref 12.0–15.0)
MCH: 30.9 pg (ref 26.0–34.0)
MCHC: 34.7 g/dL (ref 30.0–36.0)
MCV: 89.3 fL (ref 80.0–100.0)
PLATELETS: 325 10*3/uL (ref 150–400)
RBC: 4.75 MIL/uL (ref 3.87–5.11)
RDW: 14.4 % (ref 11.5–15.5)
WBC: 4.9 10*3/uL (ref 4.0–10.5)
nRBC: 0 % (ref 0.0–0.2)

## 2018-07-10 MED ORDER — AMOXICILLIN-POT CLAVULANATE 875-125 MG PO TABS: 1.0000 | ORAL_TABLET | Freq: Two times a day (BID) | ORAL | 0 refills | Status: AC

## 2018-07-10 MED ORDER — PREDNISONE 20 MG PO TABS: 60.0000 mg | ORAL_TABLET | Freq: Every day | ORAL | 0 refills | Status: DC

## 2018-07-10 MED ORDER — BENZONATATE 100 MG PO CAPS: 100.0000 mg | ORAL_CAPSULE | Freq: Four times a day (QID) | ORAL | 0 refills | Status: DC | PRN

## 2018-07-10 MED ORDER — KETOROLAC TROMETHAMINE 10 MG PO TABS: 10.0000 mg | ORAL_TABLET | Freq: Three times a day (TID) | ORAL | 0 refills | Status: DC | PRN

## 2018-07-10 MED ORDER — ALBUTEROL SULFATE (2.5 MG/3ML) 0.083% IN NEBU
5.0000 mg | INHALATION_SOLUTION | Freq: Once | RESPIRATORY_TRACT | Status: AC
  Administered 2018-07-10: 5 mg via RESPIRATORY_TRACT
  Filled 2018-07-10: qty 6

## 2018-07-10 NOTE — ED Provider Notes (Signed)
[facility] Emergency Department Provider Note  ____________________________________________  Time seen: Approximately 4:13 PM  I have reviewed the triage vital signs and the nursing notes.   HISTORY  Chief Complaint Cough and Possible Pneumonia    HPI Donna Howard is a 48 y.o. female with asthma and ongoing tobacco abuse, presenting w/ cough, congestion and rhinorrhea, wheezing and sob, R face swelling and back pain.  The pt's cough and cold sx's started 3d ago, and she has not had any sore throat, ear pain, fever, n/v/d, abd pain.  No chest pain.  She has been having wheezing and exertional sob w/o LE edema.  No improvement with her Albuterol.  She saw her PMD yesterday who told her it was viral and she wants abx.  She also notes mild R facial swelling w/o dental pain, HA or trauma.  She has chronic back pain that is exacerbated by her cough and has improved w/ Toradol in the past.     Past Medical History:  Diagnosis Date  . Asthma     There are no active problems to display for this patient.   Past Surgical History:  Procedure Laterality Date  . TUBAL LIGATION      Current Outpatient Rx  . Order #: [id number] Class: Historical Med  . Order #: [id number] Class: Print  . Order #: [id number] Class: Print  . Order #: [id number] Class: Historical Med  . Order #: [id number] Class: Print  . Order #: [id number] Class: Print  . Order #: [id number] Class: Print  . Order #: [id number] Class: Print  . Order #: [id number] Class: Print  . Order #: [id number] Class: Print  . Order #: [id number] Class: Print  . Order #: [id number] Class: Normal  . Order #: [id number] Class: Normal  . Order #: [id number] Class: Print    Allergies Patient has no known allergies.  History reviewed. No pertinent family history.  Social History Social History   Tobacco Use  . Smoking status: Current Every Day Smoker    Packs/day: 0.50    Years: 24.00    Pack years: 12.00    Types: Cigarettes   . Smokeless tobacco: Never Used  Substance Use Topics  . Alcohol use: No    Alcohol/week: 0.0 standard drinks  . Drug use: No    Review of Systems Constitutional: No fever/chills.  No myalgias. Eyes: No visual changes. No eye discharge. ENT: No sore throat. +congestion +rhinorrhea. No ear pain. Cardiovascular: Denies chest pain. Denies palpitations. Respiratory: + wheezing and shortness of breath.  + cough. Gastrointestinal: No abdominal pain.  No nausea, no vomiting.  No diarrhea.  No constipation. Genitourinary: Negative for dysuria. Musculoskeletal: + acute on chronic back pain. Skin: Negative for rash. Neurological: Negative for headaches. No focal numbness, tingling or weakness.     ____________________________________________   PHYSICAL EXAM:  VITAL SIGNS: ED Triage Vitals  Enc Vitals Group     BP 07/10/18 1146 (!) 130/98     Pulse Rate 07/10/18 1146 98     Resp 07/10/18 1146 16     Temp 07/10/18 1146 98.4 F (36.9 C)     Temp Source 07/10/18 1146 Oral     SpO2 07/10/18 1146 98 %     Weight 07/10/18 1144 130 lb (59 kg)     Height 07/10/18 1144 5\' 7"  (1.702 m)     Head Circumference --      Peak Flow --      Pain Score 07/10/18 1143 8     Pain Loc --  Pain Edu? --      Excl. in GC? --     Constitutional: Alert and oriented. Answers questions appropriately.  Chronically ill appearing. Eyes: Conjunctivae are normal.  EOMI. No scleral icterus. No eye discharge. Head: Atraumatic.  + pressure in the maxillary and frontal sinuses with palpation.  Minimal swelling noted over L orbit/eyebrow and L zygomatic arch w/o bruising or skin changes. Nose: + congestion w/o rhinorrhea Mouth/Throat: Mucous membranes are mildly dry..  Neck: No stridor.  Supple.  No JVD. No meningismus. Cardiovascular: Normal rate, regular rhythm. No murmurs, rubs or gallops.  Respiratory: Normal respiratory effort.  No accessory muscle use or retractions. Lungs CTAB.  No wheezes, rales or  ronchi.  Normal O2 sats. Gastrointestinal: Soft, nontender and nondistended.  No guarding or rebound.  No peritoneal signs. Musculoskeletal: No LE edema. No ttp in the calves or palpable cords.  Negative Homan's sign. Neurologic:  A&Ox3.  Speech is clear.  Face and smile are symmetric.  EOMI.  Moves all extremities well. Skin:  Skin is warm, dry and intact. No rash noted. Psychiatric: Mood and affect are normal.   ____________________________________________   LABS (all labs ordered are listed, but only abnormal results are displayed)  Labs Reviewed  COMPREHENSIVE METABOLIC PANEL - Abnormal; Notable for the following components:      Result Value   Potassium 3.3 (*)    CO2 21 (*)    Glucose, Bld 116 (*)    BUN <5 (*)    Calcium 8.7 (*)    All other components within normal limits  CBC   ____________________________________________  EKG  EKG done at 1600 and interpreted by myself shows rate 90, NSR, nl axis, nl intervals, no STEMI.  ____________________________________________  RADIOLOGY  Dg Chest 2 View  Result Date: 07/10/2018 CLINICAL DATA:  48 year old female with a history of chest tightness and cough EXAM: CHEST - 2 VIEW COMPARISON:  05/11/2018 FINDINGS: Cardiomediastinal silhouette unchanged in size and contour. No evidence of central vascular congestion. No pneumothorax. No pleural effusion. Coarsened interstitial markings throughout. Interval clearing of the airspace opacity at the right lung base on the prior. Evidence of developing emphysema. IMPRESSION: Chronic lung changes without evidence of acute cardiopulmonary disease. Interval resolution of right lower lobe pneumonia Electronically Signed   By: Gilmer MorJaime  Wagner D.O.   On: 07/10/2018 12:38    ____________________________________________   PROCEDURES  Procedure(s) performed: None  Procedures  Critical Care performed: No ____________________________________________   INITIAL IMPRESSION / ASSESSMENT AND  PLAN / ED COURSE  Pertinent labs & imaging results that were available during my care of the patient were reviewed by me and considered in my medical decision making (see chart for details).  48 y.o. F w/ ongoing tobacco abuse and asthma presenting w/ cough/cold sx's for three days, wheezing and SOB.  On my exam the pt is well appearing and hemodynamically stable.  She has no evidence of pna on CXR or auscultation.  Normal wbc, normal hgb/hct, nl electrolytes. She likely has a viral URI resulting in asthma (or COPD given her smoking hx) exacerbation.  I will treat her with ongoing albuterol with her MDI plus 5d of prednisone burst, and tessalon perles.  Given her facial findings, sinusitis is not excluded, so plan 14d of Augmentin.  Toradol for back pain; precautions given.  Follow up instrucions/Return precautions reviewed personally.  ____________________________________________  FINAL CLINICAL IMPRESSION(S) / ED DIAGNOSES  Final diagnoses:  Upper respiratory tract infection, unspecified type  COPD exacerbation (HCC)  Swelling  of right side of face  Chronic midline low back pain without sciatica  Tobacco abuse         NEW MEDICATIONS STARTED DURING THIS VISIT:  New Prescriptions   AMOXICILLIN-CLAVULANATE (AUGMENTIN) 875-125 MG TABLET    Take 1 tablet by mouth every 12 (twelve) hours for 14 days.   BENZONATATE (TESSALON PERLES) 100 MG CAPSULE    Take 1 capsule (100 mg total) by mouth every 6 (six) hours as needed for cough.   KETOROLAC (TORADOL) 10 MG TABLET    Take 1 tablet (10 mg total) by mouth every 8 (eight) hours as needed for moderate pain (with food).   PREDNISONE (DELTASONE) 20 MG TABLET    Take 3 tablets (60 mg total) by mouth daily.      Rockne Menghini, MD 07/10/18 1620

## 2018-07-10 NOTE — ED Notes (Signed)
Dr Sharma Covert at bedside. EKG obtained.

## 2018-07-10 NOTE — ED Notes (Addendum)
Pt c/o SOB, expiratory wheezes noted to bilateral lower lung fields. O2 sats 99% on RA.

## 2018-07-10 NOTE — Discharge Instructions (Signed)
For your cough, you may continue to take your Albuterol inhaler and use Tessalon Perles as needed.    The swelling of your face may be due to sinusitis, but the exact cause is still uncertain.  Please take the entire course of Augmentin, to see if this improves your symptoms.  Please take steps to stop smoking or decrease your smoking.  You may take Tylenol or Toradol for your back pain.  Do not use other NSAID medications, such as Advil, Motrin, Alleve or Ibuprofen, when you use Toradol.  Return to the emergency department for severe pain, shortness of breath, fever, inability to keep down fluids or any other symptoms concerning to you.

## 2018-07-10 NOTE — ED Notes (Signed)
First RN rounded on patient in the lobby. Delay explained to patient. Pt states understanding.  

## 2018-07-10 NOTE — ED Notes (Signed)
Pt verbalized understanding of dc instructions.

## 2018-07-10 NOTE — ED Notes (Signed)
Pt states lower back pain, cough and shob. Appears in NAD, having tightness in her chest post walking from lobby to room 31-states it felt like this when she had pneumonia in Dec.

## 2018-07-10 NOTE — ED Triage Notes (Signed)
Pt to ED reporting cough, congestion and diaphoresis. Pt reports feeling similar to last year when she had pneumonia. Pt was seen by PCP who did not do a chest Xray and is to ED requesting a Xray. Hx of asthma and pt reports intermittent troubles catching her breath. No increased WOB at this time.

## 2018-10-13 ENCOUNTER — Other Ambulatory Visit: Payer: Self-pay

## 2018-10-13 ENCOUNTER — Emergency Department
Admission: EM | Admit: 2018-10-13 | Discharge: 2018-10-14 | Disposition: A | Discharge status: Home / Self Care | Payer: Self-pay | Attending: Emergency Medicine | Admitting: Emergency Medicine

## 2018-10-13 DIAGNOSIS — N390 Urinary tract infection, site not specified: Secondary | ICD-10-CM

## 2018-10-13 DIAGNOSIS — R1030 Lower abdominal pain, unspecified: Secondary | ICD-10-CM | POA: Insufficient documentation

## 2018-10-13 DIAGNOSIS — Z79899 Other long term (current) drug therapy: Secondary | ICD-10-CM | POA: Insufficient documentation

## 2018-10-13 DIAGNOSIS — J45909 Unspecified asthma, uncomplicated: Secondary | ICD-10-CM | POA: Insufficient documentation

## 2018-10-13 DIAGNOSIS — F17210 Nicotine dependence, cigarettes, uncomplicated: Secondary | ICD-10-CM | POA: Insufficient documentation

## 2018-10-13 LAB — CBC
HCT: 38.3 % (ref 36.0–46.0)
Hemoglobin: 13 g/dL (ref 12.0–15.0)
MCH: 29.8 pg (ref 26.0–34.0)
MCHC: 33.9 g/dL (ref 30.0–36.0)
MCV: 87.8 fL (ref 80.0–100.0)
Platelets: 410 10*3/uL — ABNORMAL HIGH (ref 150–400)
RBC: 4.36 MIL/uL (ref 3.87–5.11)
RDW: 14.5 % (ref 11.5–15.5)
WBC: 16 10*3/uL — ABNORMAL HIGH (ref 4.0–10.5)
nRBC: 0 % (ref 0.0–0.2)

## 2018-10-13 LAB — LIPASE, BLOOD: Lipase: 26 U/L (ref 11–51)

## 2018-10-13 LAB — COMPREHENSIVE METABOLIC PANEL
ALT: 82 U/L — ABNORMAL HIGH (ref 0–44)
AST: 117 U/L — ABNORMAL HIGH (ref 15–41)
Albumin: 3.9 g/dL (ref 3.5–5.0)
Alkaline Phosphatase: 124 U/L (ref 38–126)
Anion gap: 8 (ref 5–15)
BUN: 6 mg/dL (ref 6–20)
CO2: 25 mmol/L (ref 22–32)
Calcium: 9 mg/dL (ref 8.9–10.3)
Chloride: 105 mmol/L (ref 98–111)
Creatinine, Ser: 0.73 mg/dL (ref 0.44–1.00)
GFR calc Af Amer: >60 mL/min (ref >60)
GFR calc non Af Amer: >60 mL/min (ref >60)
Glucose, Bld: 118 mg/dL — ABNORMAL HIGH (ref 70–99)
Potassium: 3.5 mmol/L (ref 3.5–5.1)
Sodium: 138 mmol/L (ref 135–145)
Total Bilirubin: 0.6 mg/dL (ref 0.3–1.2)
Total Protein: 7.1 g/dL (ref 6.5–8.1)

## 2018-10-13 LAB — URINALYSIS, COMPLETE (UACMP) WITH MICROSCOPIC
Bilirubin Urine: NEGATIVE
Glucose, UA: NEGATIVE mg/dL
Ketones, ur: NEGATIVE mg/dL
Nitrite: NEGATIVE
Protein, ur: 30 mg/dL — AB
Specific Gravity, Urine: 1.004 — ABNORMAL LOW (ref 1.005–1.030)
WBC, UA: >50 WBC/hpf — ABNORMAL HIGH (ref 0–5)
pH: 6 (ref 5.0–8.0)

## 2018-10-13 LAB — POCT PREGNANCY, URINE: Preg Test, Ur: NEGATIVE

## 2018-10-13 MED ORDER — SODIUM CHLORIDE 0.9 % IV SOLN
1.0000 g | Freq: Once | INTRAVENOUS | Status: AC
  Administered 2018-10-13: 1 g via INTRAVENOUS
  Filled 2018-10-13: qty 10

## 2018-10-13 MED ORDER — KETOROLAC TROMETHAMINE 30 MG/ML IJ SOLN
30.0000 mg | Freq: Once | INTRAMUSCULAR | Status: AC
  Administered 2018-10-13: 30 mg via INTRAVENOUS
  Filled 2018-10-13: qty 1

## 2018-10-13 NOTE — ED Triage Notes (Signed)
Patient reports having mid lower abdominal pain since this morning.  Patient also reports urinary frequency and pressure and thinks she might have seen blood.

## 2018-10-13 NOTE — ED Notes (Signed)
Pt unable to give urine sample at this time. Pt has specimen cup. 

## 2018-10-13 NOTE — ED Provider Notes (Signed)
[facility] Emergency Department Provider Note   ____________________________________________    I have reviewed the triage vital signs and the nursing notes.   HISTORY  Chief Complaint Abdominal Pain     HPI Donna Howard is a 48 y.o. female presents with complaints of lower abdominal pain.  Patient reports over the last 24 hours she is developed dysuria, frequency and urgency.  She reports seeing a small amount of blood in her urine yesterday.  Today she has noted increasing pressure sensation in her lower abdomen centrally.  She has never had this before.  Denies a history of urinary tract infections in her past.  No flank pain.  Denies fevers.  No nausea or vomiting.  Past Medical History:  Diagnosis Date  . Asthma     There are no active problems to display for this patient.   Past Surgical History:  Procedure Laterality Date  . TUBAL LIGATION      Prior to Admission medications   Medication Sig Start Date End Date Taking? Authorizing Provider  albuterol (PROVENTIL HFA;VENTOLIN HFA) 108 (90 BASE) MCG/ACT inhaler Inhale 2 puffs into the lungs every 6 (six) hours as needed for wheezing or shortness of breath.    [provider]  azithromycin (ZITHROMAX Z-PAK) 250 MG tablet Take 2 tablets (500 mg) on  Day 1,  followed by 1 tablet (250 mg) once daily on Days 2 through 5. 05/11/18   Merrily Brittle, MD  beclomethasone (QVAR) 80 MCG/ACT inhaler Inhale 1 puff into the lungs 2 (two) times daily.    [provider]  benzonatate (TESSALON PERLES) 100 MG capsule Take 1 capsule (100 mg total) by mouth every 6 (six) hours as needed for cough. 07/10/18   Rockne Menghini, MD  cephALEXin (KEFLEX) 500 MG capsule Take 1 capsule (500 mg total) by mouth 2 (two) times daily. 10/14/18   Jene Every, MD  cetirizine (ZYRTEC) 10 MG tablet Take 1 tablet (10 mg total) by mouth daily. 08/02/15   Cuthriell, Delorise Royals, PA-C   chlorpheniramine-HYDROcodone (TUSSIONEX PENNKINETIC ER) 10-8 MG/5ML SUER Take 5 mLs by mouth every 12 (twelve) hours as needed for cough (do not drive or operate heavy machinery as can cause drowsiness.). 08/06/15   Renford Dills, NP  doxycycline (VIBRAMYCIN) 100 MG capsule Take 1 capsule (100 mg total) by mouth 2 (two) times daily. 08/06/15   Renford Dills, NP  fluticasone (FLONASE) 50 MCG/ACT nasal spray Place 1 spray into both nostrils 2 (two) times daily. 08/02/15   Cuthriell, Delorise Royals, PA-C  ketorolac (TORADOL) 10 MG tablet Take 1 tablet (10 mg total) by mouth every 8 (eight) hours as needed for moderate pain (with food). 07/10/18   Rockne Menghini, MD  magic mouthwash w/lidocaine SOLN Take 5 mLs by mouth 4 (four) times daily. 08/02/15   Cuthriell, Delorise Royals, PA-C  naproxen (NAPROSYN) 500 MG tablet Take 1 tablet (500 mg total) by mouth 2 (two) times daily with a meal. 10/14/18   Jene Every, MD  ondansetron (ZOFRAN ODT) 8 MG disintegrating tablet Take 1 tablet (8 mg total) by mouth every 8 (eight) hours as needed. 06/12/16   Payton Mccallum, MD  oseltamivir (TAMIFLU) 75 MG capsule Take 1 capsule (75 mg total) by mouth 2 (two) times daily. 06/12/16   Payton Mccallum, MD  predniSONE (DELTASONE) 20 MG tablet Take 3 tablets (60 mg total) by mouth daily. 07/10/18   Rockne Menghini, MD     Allergies Patient has no known allergies.  No family history on file.  Social History Social History   Tobacco Use  . Smoking status: Current Every Day Smoker    Packs/day: 0.50    Years: 24.00    Pack years: 12.00    Types: Cigarettes  . Smokeless tobacco: Never Used  Substance Use Topics  . Alcohol use: No    Alcohol/week: 0.0 standard drinks  . Drug use: No    Review of Systems  Constitutional: No fever/chills Eyes: No visual changes.  ENT: No sore throat. Cardiovascular: Denies chest pain. Respiratory: Denies shortness of breath. Gastrointestinal: As above Genitourinary: As  above Musculoskeletal: Negative for back pain. Skin: Negative for rash. Neurological: Negative for headaches    ____________________________________________   PHYSICAL EXAM:  VITAL SIGNS: ED Triage Vitals  Enc Vitals Group     BP 10/13/18 2009 138/78     Pulse Rate 10/13/18 2009 96     Resp 10/13/18 2009 (!) 22     Temp 10/13/18 2009 98.3 F (36.8 C)     Temp Source 10/13/18 2009 Oral     SpO2 10/13/18 2009 96 %     Weight 10/13/18 2007 56.7 kg (125 lb)     Height 10/13/18 2007 1.702 m ( )     Head Circumference --      Peak Flow --      Pain Score 10/13/18 2007 8     Pain Loc --      Pain Edu? --      Excl. in GC? --     Constitutional: Alert and oriented.  Eyes: Conjunctivae are normal.   Nose: No congestion/rhinnorhea. Mouth/Throat: Mucous membranes are moist.    Cardiovascular: Normal rate, regular rhythm. Grossly normal heart sounds.  Good peripheral circulation. Respiratory: Normal respiratory effort.  No retractions. Lungs CTAB. Gastrointestinal: Mild suprapubic tenderness, no distention, no CVA tenderness. Genitourinary: deferred Musculoskeletal: .  Warm and well perfused Neurologic:  Normal speech and language. No gross focal neurologic deficits are appreciated.  Skin:  Skin is warm, dry and intact. No rash noted. Psychiatric: Mood and affect are normal. Speech and behavior are normal.  ____________________________________________   LABS (all labs ordered are listed, but only abnormal results are displayed)  Labs Reviewed  COMPREHENSIVE METABOLIC PANEL - Abnormal; Notable for the following components:      Result Value   Glucose, Bld 118 (*)    AST 117 (*)    ALT 82 (*)    All other components within normal limits  CBC - Abnormal; Notable for the following components:   WBC 16.0 (*)    Platelets 410 (*)    All other components within normal limits  URINALYSIS, COMPLETE (UACMP) WITH MICROSCOPIC - Abnormal; Notable for the following components:    Color, Urine YELLOW (*)    APPearance CLOUDY (*)    Specific Gravity, Urine 1.004 (*)    Hgb urine dipstick MODERATE (*)    Protein, ur 30 (*)    Leukocytes,Ua LARGE (*)    WBC, UA >50 (*)    Bacteria, UA RARE (*)    All other components within normal limits  LIPASE, BLOOD  POCT PREGNANCY, URINE  POC URINE PREG, ED   ____________________________________________  EKG  None ____________________________________________  RADIOLOGY   ____________________________________________   PROCEDURES  Procedure(s) performed: No  Procedures   Critical Care performed: No ____________________________________________   INITIAL IMPRESSION / ASSESSMENT AND PLAN / ED COURSE  Pertinent labs & imaging results that were available during my care of  the patient were reviewed by me and considered in my medical decision making (see chart for details).  Patient presents with dysuria frequency hematuria suprapubic tenderness most consistent with urinary tract infection.  Lab work is notable for elevated white blood cell count, urinalysis is consistent with urinary tract infection.  She has no flank pain to suggest kidney stone.  No CVA tenderness.  We will give a dose of IV Rocephin here, IV Toradol, anticipate discharge with antibiotics  Reevaluated after IV antibiotics finished, patient is feeling well, appropriate for discharge with outpatient antibiotics, return precautions discussed    ____________________________________________   FINAL CLINICAL IMPRESSION(S) / ED DIAGNOSES  Final diagnoses:  Lower urinary tract infectious disease        Note:  This document was prepared using Dragon voice recognition software and may include unintentional dictation errors.   Jene EveryKinner, Amsi Grimley, MD 10/14/18 [phone]

## 2018-10-14 MED ORDER — NAPROXEN 500 MG PO TABS: 500.0000 mg | ORAL_TABLET | Freq: Two times a day (BID) | ORAL | 2 refills | Status: DC

## 2018-10-14 MED ORDER — CEPHALEXIN 500 MG PO CAPS: 500.0000 mg | ORAL_CAPSULE | Freq: Two times a day (BID) | ORAL | 0 refills | Status: DC

## 2019-12-10 IMAGING — CR DG CHEST 2V
1 series · 2 of 2 positions shown · non-contrast
Comparison: Chest radiograph January 29, 2015

CLINICAL DATA: Cough and chest pain for a week.

EXAM:
CHEST - 2 VIEW

[Series 1: dg chest 2 view · 0.14mm/px · 2 of 2 slices shown]
[im 1/2]
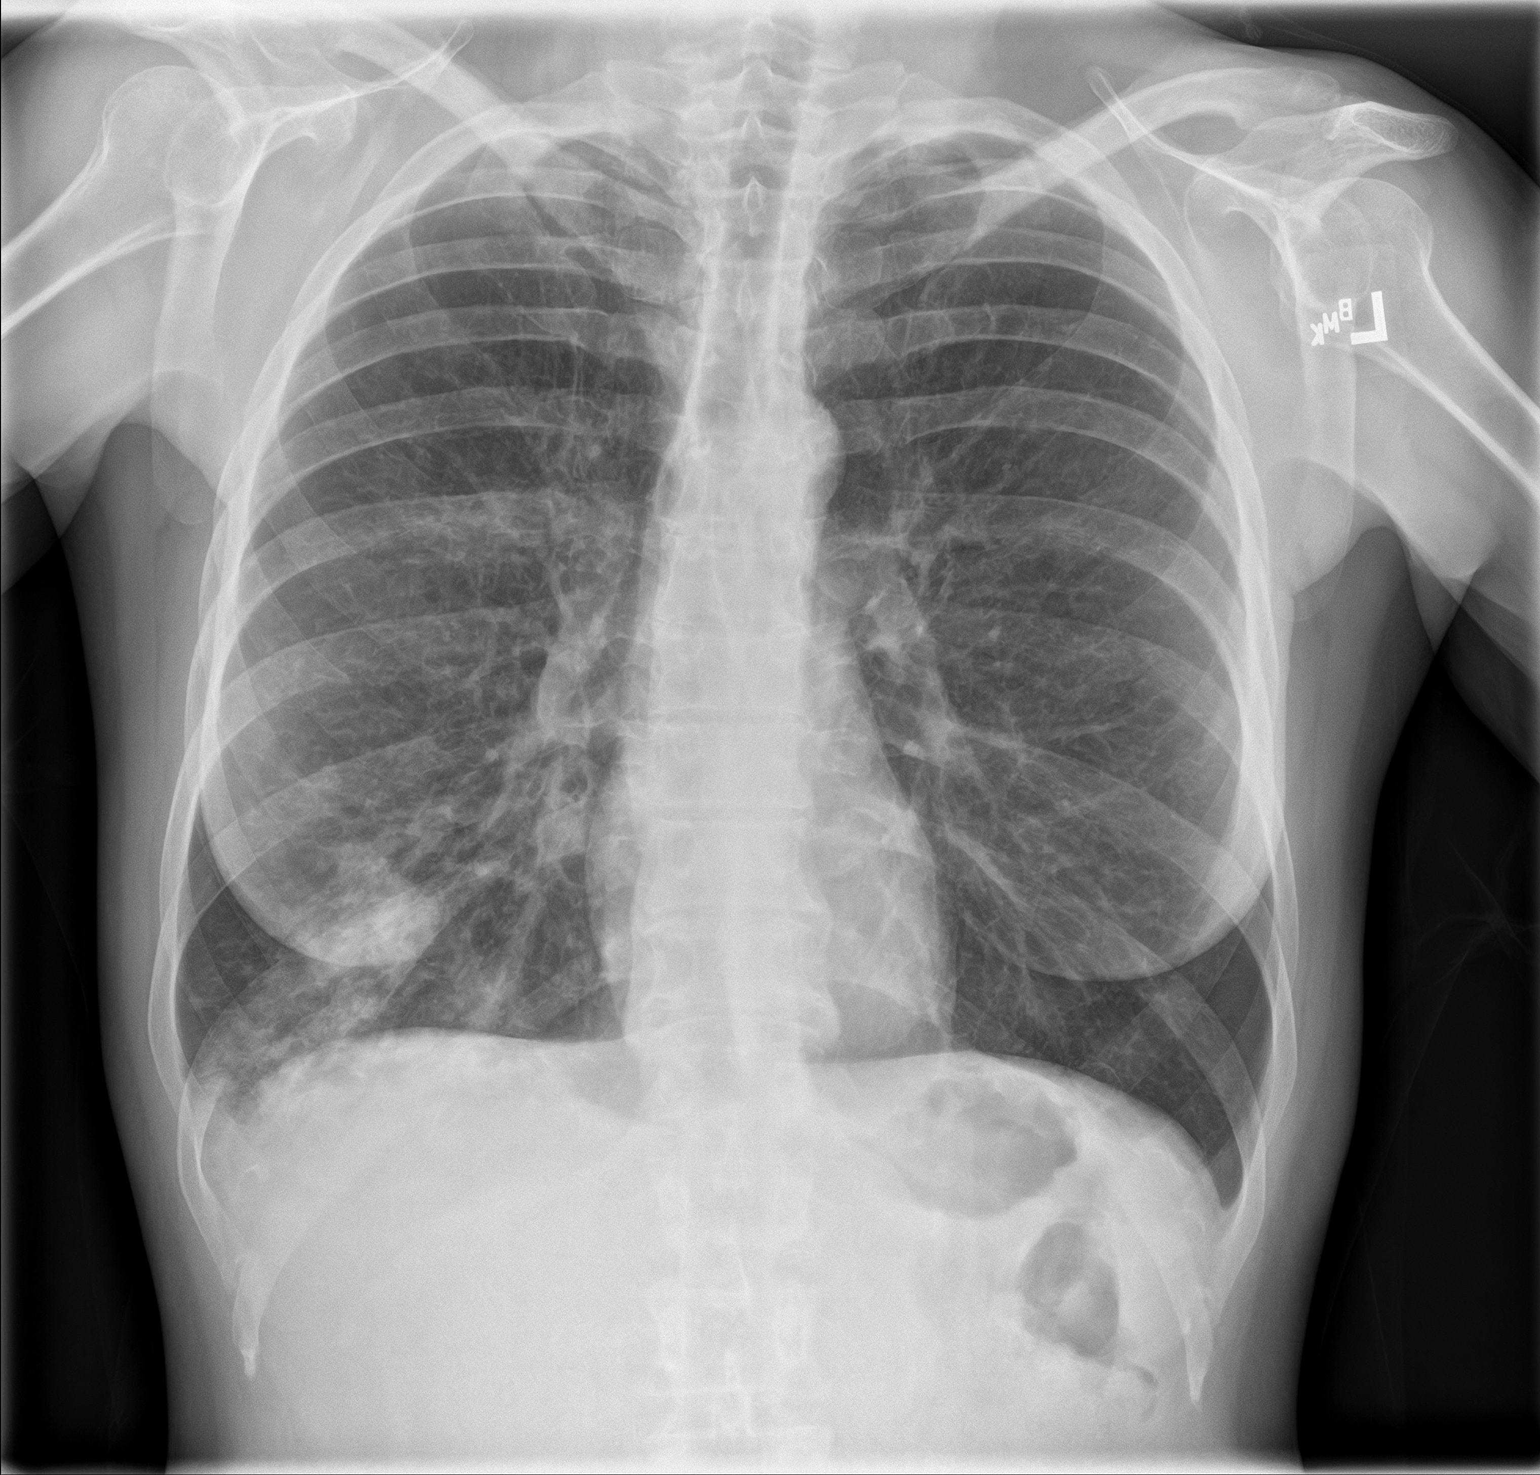
[im 2/2]
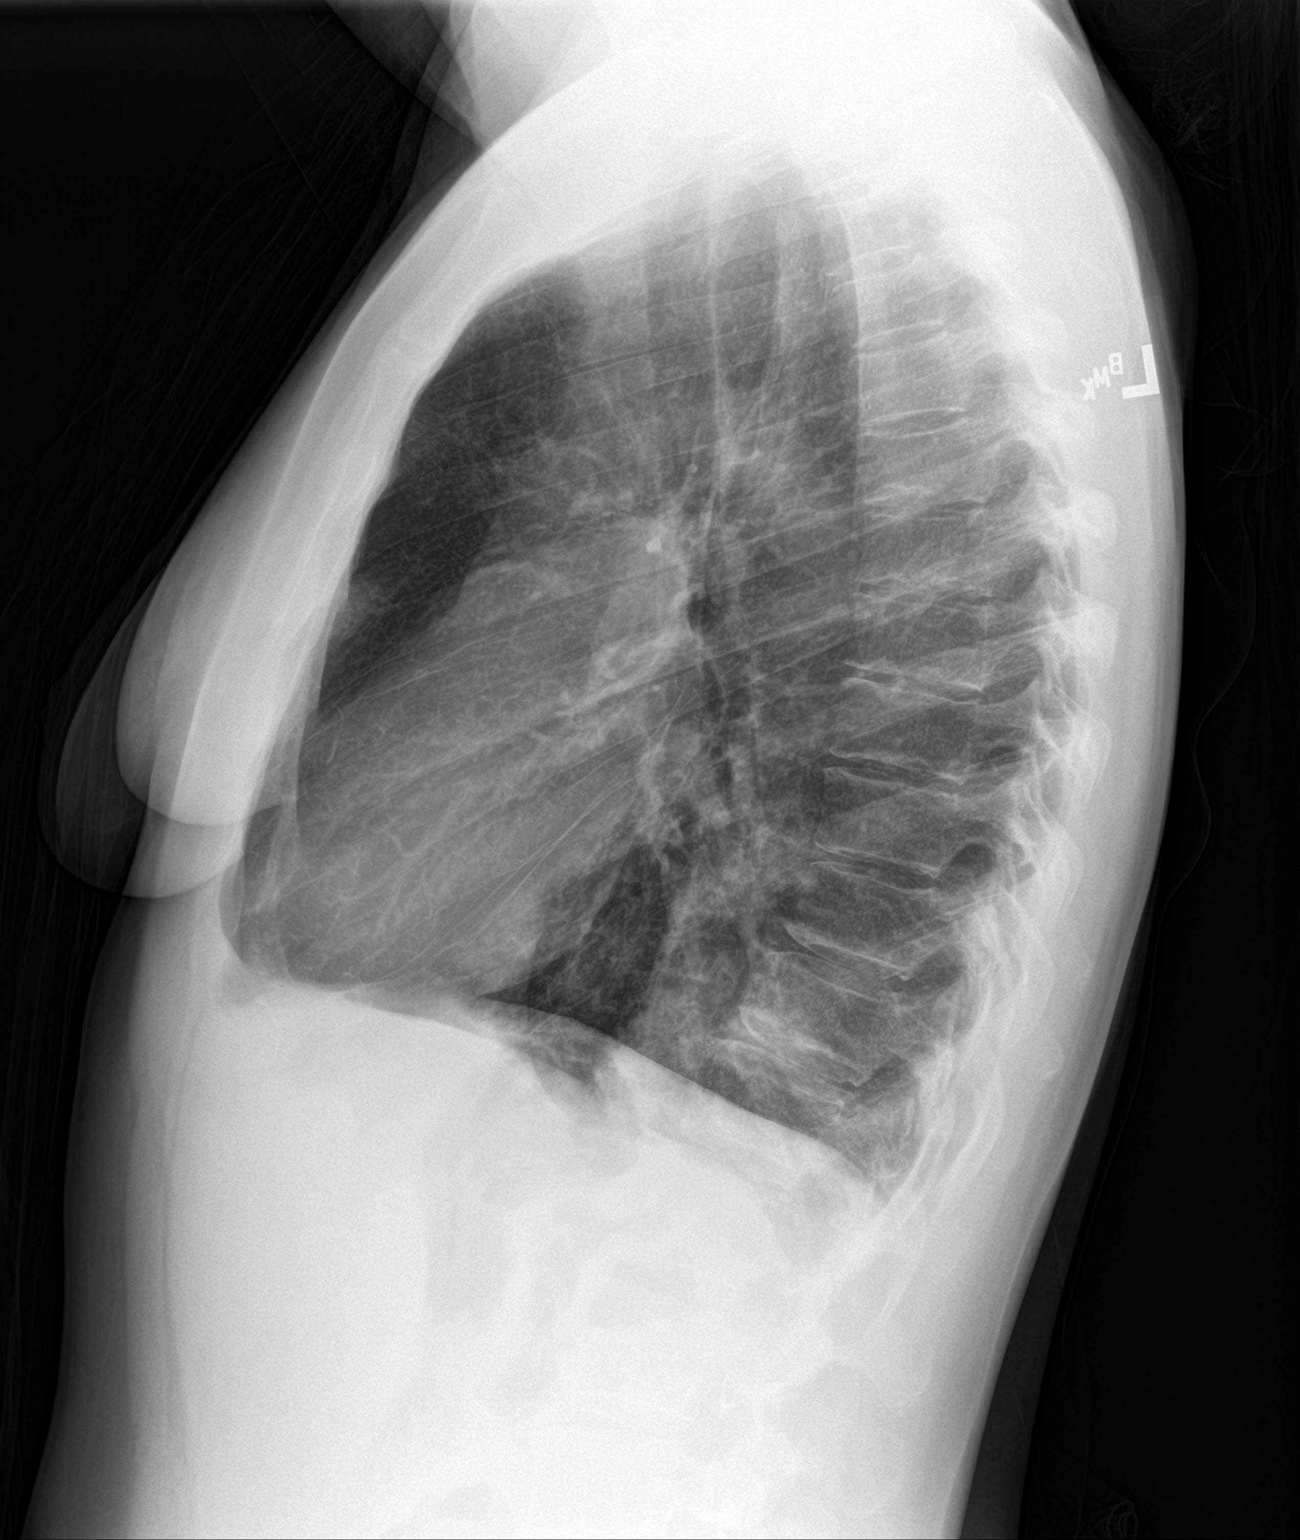

[2 of 2 positions shown; findings below may reference images not displayed]

FINDINGS: Cardiomediastinal silhouette is normal. Mild hyperinflation with
flattened hemidiaphragms. RIGHT lower lobe patchy airspace opacity.
No pleural effusion. No pneumothorax. Soft tissue planes and
included osseous structures are non suspicious.
IMPRESSION: 1. RIGHT lower lobe pneumonia. Followup PA and lateral chest X-ray
is recommended in 3-4 weeks following trial of antibiotic therapy to
ensure resolution and exclude underlying malignancy.
2. Similar hyperinflation/COPD.

## 2020-02-29 ENCOUNTER — Emergency Department
Admission: EM | Admit: 2020-02-29 | Discharge: 2020-02-29 | Disposition: A | Discharge status: Home / Self Care | Payer: Self-pay | Attending: Emergency Medicine | Admitting: Emergency Medicine

## 2020-02-29 ENCOUNTER — Encounter: Payer: Self-pay | Admitting: Emergency Medicine

## 2020-02-29 ENCOUNTER — Other Ambulatory Visit: Payer: Self-pay

## 2020-02-29 ENCOUNTER — Emergency Department: Payer: Self-pay

## 2020-02-29 DIAGNOSIS — F17210 Nicotine dependence, cigarettes, uncomplicated: Secondary | ICD-10-CM | POA: Insufficient documentation

## 2020-02-29 DIAGNOSIS — J45909 Unspecified asthma, uncomplicated: Secondary | ICD-10-CM | POA: Insufficient documentation

## 2020-02-29 DIAGNOSIS — W01198A Fall on same level from slipping, tripping and stumbling with subsequent striking against other object, initial encounter: Secondary | ICD-10-CM | POA: Insufficient documentation

## 2020-02-29 DIAGNOSIS — Z7951 Long term (current) use of inhaled steroids: Secondary | ICD-10-CM | POA: Insufficient documentation

## 2020-02-29 DIAGNOSIS — M25531 Pain in right wrist: Secondary | ICD-10-CM | POA: Insufficient documentation

## 2020-02-29 MED ORDER — OXYCODONE HCL 5 MG PO TABS
10.0000 mg | ORAL_TABLET | Freq: Once | ORAL | Status: AC
  Administered 2020-02-29: 10 mg via ORAL
  Filled 2020-02-29: qty 2

## 2020-02-29 MED ORDER — KETOROLAC TROMETHAMINE 60 MG/2ML IM SOLN
30.0000 mg | Freq: Once | INTRAMUSCULAR | Status: AC
  Administered 2020-02-29: 30 mg via INTRAMUSCULAR
  Filled 2020-02-29: qty 2

## 2020-02-29 MED ORDER — MELOXICAM 15 MG PO TABS: 15.0000 mg | ORAL_TABLET | Freq: Every day | ORAL | 0 refills | Status: DC

## 2020-02-29 MED ORDER — HYDROCODONE-ACETAMINOPHEN 5-325 MG PO TABS: 1.0000 | ORAL_TABLET | Freq: Four times a day (QID) | ORAL | 0 refills | Status: AC | PRN

## 2020-02-29 NOTE — Discharge Instructions (Signed)
Rest, ice, and elevate your hand/wrist off and on throughout the day.  Remove your rings until all swelling has gone away.  Follow up with orthopedics if not improving over the week.  Wear your splint until swelling and pain has gone except while showering.   Return to the ER for symptoms that change or worsen if unable to schedule an appointment.

## 2020-02-29 NOTE — ED Provider Notes (Signed)
[facility] Emergency Department Provider Note ____________________________________________  Time seen: Approximately 1:42 PM  I have reviewed the triage vital signs and the nursing notes.   HISTORY  Chief Complaint Wrist Pain    HPI Donna Howard is a 49 y.o. female who presents to the emergency department for evaluation and treatment of right wrist pain after mechanical, non-syncopal fall last night. She tripped and fell forward and tried to catch herself. No previous fracture. No relief with tylenol.  Past Medical History:  Diagnosis Date  . Asthma     There are no problems to display for this patient.   Past Surgical History:  Procedure Laterality Date  . TUBAL LIGATION      Prior to Admission medications   Medication Sig Start Date End Date Taking? Authorizing Provider  albuterol (PROVENTIL HFA;VENTOLIN HFA) 108 (90 BASE) MCG/ACT inhaler Inhale 2 puffs into the lungs every 6 (six) hours as needed for wheezing or shortness of breath.    [provider]  azithromycin (ZITHROMAX Z-PAK) 250 MG tablet Take 2 tablets (500 mg) on  Day 1,  followed by 1 tablet (250 mg) once daily on Days 2 through 5. 05/11/18   Merrily Brittle, MD  beclomethasone (QVAR) 80 MCG/ACT inhaler Inhale 1 puff into the lungs 2 (two) times daily.    [provider]  benzonatate (TESSALON PERLES) 100 MG capsule Take 1 capsule (100 mg total) by mouth every 6 (six) hours as needed for cough. 07/10/18   Rockne Menghini, MD  cephALEXin (KEFLEX) 500 MG capsule Take 1 capsule (500 mg total) by mouth 2 (two) times daily. 10/14/18   Jene Every, MD  cetirizine (ZYRTEC) 10 MG tablet Take 1 tablet (10 mg total) by mouth daily. 08/02/15   Cuthriell, Delorise Royals, PA-C  chlorpheniramine-HYDROcodone (TUSSIONEX PENNKINETIC ER) 10-8 MG/5ML SUER Take 5 mLs by mouth every 12 (twelve) hours as needed for cough (do not drive or operate heavy machinery as can cause drowsiness.).  08/06/15   Renford Dills, NP  doxycycline (VIBRAMYCIN) 100 MG capsule Take 1 capsule (100 mg total) by mouth 2 (two) times daily. 08/06/15   Renford Dills, NP  fluticasone (FLONASE) 50 MCG/ACT nasal spray Place 1 spray into both nostrils 2 (two) times daily. 08/02/15   Cuthriell, Delorise Royals, PA-C  HYDROcodone-acetaminophen (NORCO/VICODIN) 5-325 MG tablet Take 1 tablet by mouth every 6 (six) hours as needed for up to 3 days for severe pain. 02/29/20 03/03/20  Keelan Pomerleau, Rulon Eisenmenger B, FNP  ketorolac (TORADOL) 10 MG tablet Take 1 tablet (10 mg total) by mouth every 8 (eight) hours as needed for moderate pain (with food). 07/10/18   Rockne Menghini, MD  magic mouthwash w/lidocaine SOLN Take 5 mLs by mouth 4 (four) times daily. 08/02/15   Cuthriell, Delorise Royals, PA-C  meloxicam (MOBIC) 15 MG tablet Take 1 tablet (15 mg total) by mouth daily. 02/29/20   Tirth Cothron, Rulon Eisenmenger B, FNP  naproxen (NAPROSYN) 500 MG tablet Take 1 tablet (500 mg total) by mouth 2 (two) times daily with a meal. 10/14/18   Jene Every, MD  ondansetron (ZOFRAN ODT) 8 MG disintegrating tablet Take 1 tablet (8 mg total) by mouth every 8 (eight) hours as needed. 06/12/16   Payton Mccallum, MD  oseltamivir (TAMIFLU) 75 MG capsule Take 1 capsule (75 mg total) by mouth 2 (two) times daily. 06/12/16   Payton Mccallum, MD  predniSONE (DELTASONE) 20 MG tablet Take 3 tablets (60 mg total) by mouth daily. 07/10/18   Rockne Menghini, MD  Allergies Patient has no known allergies.  History reviewed. No pertinent family history.  Social History Social History   Tobacco Use  . Smoking status: Current Every Day Smoker    Packs/day: 0.50    Years: 24.00    Pack years: 12.00    Types: Cigarettes  . Smokeless tobacco: Never Used  Substance Use Topics  . Alcohol use: No    Alcohol/week: 0.0 standard drinks  . Drug use: No    Review of Systems Constitutional: Negative for fever. Cardiovascular: Negative for chest pain. Respiratory:  Negative for shortness of breath. Musculoskeletal: Positive for right wrist pain Skin: Negative for open wound or lesions.  Neurological: Negative for decrease in sensation  ____________________________________________   PHYSICAL EXAM:  VITAL SIGNS: ED Triage Vitals  Enc Vitals Group     BP 02/29/20 1213 (!) 107/93     Pulse Rate 02/29/20 1213 77     Resp 02/29/20 1213 20     Temp 02/29/20 1213 97.9 F (36.6 C)     Temp Source 02/29/20 1213 Oral     SpO2 02/29/20 1213 100 %     Weight 02/29/20 1214 125 lb (56.7 kg)     Height 02/29/20 1214 5\' 7"  (1.702 m)     Head Circumference --      Peak Flow --      Pain Score 02/29/20 1214 9     Pain Loc --      Pain Edu? --      Excl. in GC? --     Constitutional: Alert and oriented. Well appearing and in no acute distress. Eyes: Conjunctivae are clear without discharge or drainage Head: Atraumatic Neck: Supple Respiratory: No cough. Respirations are even and unlabored. Musculoskeletal: Diffuse swelling over wrist and distal forearm without obvious deformity. Neurologic: Motor and sensory function of right hand/fingers intact.  Skin: Early ecchymosis over the right wrist.   Psychiatric: Affect and behavior are appropriate.  ____________________________________________   LABS (all labs ordered are listed, but only abnormal results are displayed)  Labs Reviewed - No data to display ____________________________________________  RADIOLOGY  Image of the right wrist is negative for acute findings.  I, 04/30/20, personally viewed and evaluated these images (plain radiographs) as part of my medical decision making, as well as reviewing the written report by the radiologist.  DG Wrist Complete Right  Result Date: 02/29/2020 CLINICAL DATA:  Wrist swelling and pain EXAM: RIGHT WRIST - COMPLETE 3+ VIEW COMPARISON:  January 30, 2013 FINDINGS: No definite acute fracture or dislocation. Similar appearance of a mildly convex  contour of the radial aspect of the distal radius. Joint spaces and alignment are maintained. No area of erosion or osseous destruction. No unexpected radiopaque foreign body. Soft tissues are unremarkable. IMPRESSION: No definite acute fracture or dislocation. Subtle convex contour of the distal radius is similar in comparison to prior radiograph from 2014. Correlate with point tenderness to exclude small nondisplaced fracture. If persistent clinical concern for scaphoid fracture, recommend immobilization and follow-up radiographs in 2 weeks versus MRI. Electronically Signed   By: 2015 MD   On: 02/29/2020 13:34   ____________________________________________   PROCEDURES  Procedures  ____________________________________________   INITIAL IMPRESSION / ASSESSMENT AND PLAN / ED COURSE  Donna Howard is a 48 y.o. who presents to the emergency department for treatment and evaluation of right wrist pain after mechanical, nonsyncopal fall last night.  See HPI for further details.  Images negative for acute fracture however there is  diffuse swelling over the distal forearm and wrist.  She will be placed in a prefabricated Velcro splint and encouraged to take medication as prescribed.  She is to follow-up with orthopedics for symptoms that are not improving over the week.  She was also instructed to return to the emergency department for symptoms of change or worsen if she is unable to schedule an appointment with orthopedics or primary care.  Medications  oxyCODONE (Oxy IR/ROXICODONE) immediate release tablet 10 mg (10 mg Oral Given 02/29/20 1455)  ketorolac (TORADOL) injection 30 mg (30 mg Intramuscular Given 02/29/20 1456)    Pertinent labs & imaging results that were available during my care of the patient were reviewed by me and considered in my medical decision making (see chart for details).   _________________________________________   FINAL CLINICAL IMPRESSION(S) / ED  DIAGNOSES  Final diagnoses:  Acute pain of right wrist    ED Discharge Orders         Ordered    HYDROcodone-acetaminophen (NORCO/VICODIN) 5-325 MG tablet  Every 6 hours PRN        02/29/20 1406    meloxicam (MOBIC) 15 MG tablet  Daily        02/29/20 1406           If controlled substance prescribed during this visit, 12 month history viewed on the [facility] prior to issuing an initial prescription for Schedule II or III opiod.   Chinita Pester, FNP 02/29/20 1814    Shaune Pollack, MD 02/29/20 2134

## 2020-02-29 NOTE — ED Triage Notes (Signed)
Pt presents to ED via POV with c/o R wrist pain/swelling after a fall. Pt states fell down a few steps last night, only c/o pain and swelling to R wrist.

## 2020-11-03 ENCOUNTER — Other Ambulatory Visit: Payer: Self-pay

## 2020-11-03 ENCOUNTER — Ambulatory Visit
Admission: EM | Admit: 2020-11-03 | Discharge: 2020-11-03 | Disposition: A | Discharge status: Home / Self Care | Payer: Self-pay | Attending: Family Medicine | Admitting: Family Medicine

## 2020-11-03 ENCOUNTER — Encounter: Payer: Self-pay | Admitting: Emergency Medicine

## 2020-11-03 DIAGNOSIS — F411 Generalized anxiety disorder: Secondary | ICD-10-CM

## 2020-11-03 DIAGNOSIS — J209 Acute bronchitis, unspecified: Secondary | ICD-10-CM

## 2020-11-03 MED ORDER — ONDANSETRON 4 MG PO TBDP: 4.0000 mg | ORAL_TABLET | Freq: Three times a day (TID) | ORAL | 0 refills | Status: DC | PRN

## 2020-11-03 MED ORDER — HYDROXYZINE HCL 25 MG PO TABS: 25.0000 mg | ORAL_TABLET | Freq: Three times a day (TID) | ORAL | 0 refills | Status: DC | PRN

## 2020-11-03 MED ORDER — AZITHROMYCIN 250 MG PO TABS: ORAL_TABLET | ORAL | 0 refills | Status: DC

## 2020-11-03 MED ORDER — PREDNISONE 50 MG PO TABS: ORAL_TABLET | ORAL | 0 refills | Status: DC

## 2020-11-03 NOTE — Discharge Instructions (Addendum)
Medication as prescribed. ° °If you worsen, please go to the ER. ° °Take care ° °Dr. Blyss Lugar  °

## 2020-11-03 NOTE — ED Provider Notes (Signed)
[facility]    CSN: [id number] Arrival date & time: 11/03/20  1856      History   Chief Complaint Chief Complaint  Patient presents with   Fever   Cough   HPI  50 year old female presents with the above complaints.  Patient states that she has been sick since Sunday.  She reports cough, chest tightness, fever.  She states that she has had body aches and just feels very poorly.  She is an every day smoker.  She has a history of asthma.  Questionable COPD.  Patient also states that she has had some nausea and vomiting over the past few weeks.  She states that she is very anxious.  She states that she is "overwhelmed".  She has a lot on her plate per her report.  She would like to discuss something for her anxiety today.  No current fever.  No relieving factors.  No other complaints  Past Medical History:  Diagnosis Date   Asthma    Past Surgical History:  Procedure Laterality Date   TUBAL LIGATION      OB History   No obstetric history on file.      Home Medications    Prior to Admission medications   Medication Sig Start Date End Date Taking? Authorizing Provider  azithromycin (ZITHROMAX) 250 MG tablet 2 tablets on day 1, then 1 tablet daily on days 2-5. 11/03/20  Yes Ramsie Ostrander G, DO  hydrOXYzine (ATARAX/VISTARIL) 25 MG tablet Take 1 tablet (25 mg total) by mouth every 8 (eight) hours as needed for anxiety. 11/03/20  Yes Jamell Laymon G, DO  ondansetron (ZOFRAN ODT) 4 MG disintegrating tablet Take 1 tablet (4 mg total) by mouth every 8 (eight) hours as needed for nausea or vomiting. 11/03/20  Yes Adriana Simas, Murlene Revell G, DO  predniSONE (DELTASONE) 50 MG tablet 1 tablet daily x 5 days 11/03/20  Yes Emmet Messer G, DO  albuterol (PROVENTIL HFA;VENTOLIN HFA) 108 (90 BASE) MCG/ACT inhaler Inhale 2 puffs into the lungs every 6 (six) hours as needed for wheezing or shortness of breath.    [provider]  beclomethasone (QVAR) 80 MCG/ACT inhaler Inhale 1 puff into the  lungs 2 (two) times daily.    [provider]  cetirizine (ZYRTEC) 10 MG tablet Take 1 tablet (10 mg total) by mouth daily. 08/02/15   Cuthriell, Delorise Royals, PA-C  magic mouthwash w/lidocaine SOLN Take 5 mLs by mouth 4 (four) times daily. 08/02/15   Cuthriell, Delorise Royals, PA-C    Family History History reviewed. No pertinent family history.  Social History Social History   Tobacco Use   Smoking status: Every Day    Packs/day: 0.50    Years: 24.00    Pack years: 12.00    Types: Cigarettes   Smokeless tobacco: Never  Vaping Use   Vaping Use: Never used  Substance Use Topics   Alcohol use: No    Alcohol/week: 0.0 standard drinks   Drug use: No     Allergies   Patient has no known allergies.   Review of Systems Review of Systems Per HPI  Physical Exam Triage Vital Signs ED Triage Vitals  Enc Vitals Group     BP 11/03/20 1911 122/84     Pulse Rate 11/03/20 1911 96     Resp 11/03/20 1911 18     Temp 11/03/20 1911 98.9 F (37.2 C)     Temp Source 11/03/20 1911 Oral     SpO2 11/03/20 1911  98 %     Weight --      Height --      Head Circumference --      Peak Flow --      Pain Score 11/03/20 1907 0     Pain Loc --      Pain Edu? --      Excl. in GC? --    No data found.  Updated Vital Signs BP 122/84 (BP Location: Right Arm)   Pulse 96   Temp 98.9 F (37.2 C) (Oral) Comment: drinking a cold drink in triage  Resp 18   LMP 11/02/2020   SpO2 98%   Visual Acuity Right Eye Distance:   Left Eye Distance:   Bilateral Distance:    Right Eye Near:   Left Eye Near:    Bilateral Near:     Physical Exam Vitals and nursing note reviewed.  Constitutional:      General: She is not in acute distress.    Appearance: Normal appearance.  HENT:     Head: Normocephalic and atraumatic.     Mouth/Throat:     Pharynx: Posterior oropharyngeal erythema present. No oropharyngeal exudate.  Eyes:     General:        Right eye: No discharge.        Left eye: No  discharge.     Conjunctiva/sclera: Conjunctivae normal.  Cardiovascular:     Rate and Rhythm: Normal rate and regular rhythm.  Pulmonary:     Effort: Pulmonary effort is normal.     Breath sounds: Normal breath sounds. No wheezing or rales.  Abdominal:     General: There is no distension.     Palpations: Abdomen is soft.     Tenderness: There is no abdominal tenderness.  Neurological:     Mental Status: She is alert.  Psychiatric:     Comments: Anxious, tearful.     UC Treatments / Results  Labs (all labs ordered are listed, but only abnormal results are displayed) Labs Reviewed - No data to display  EKG   Radiology No results found.  Procedures Procedures (including critical care time)  Medications Ordered in UC Medications - No data to display  Initial Impression / Assessment and Plan / UC Course  I have reviewed the triage vital signs and the nursing notes.  Pertinent labs & imaging results that were available during my care of the patient were reviewed by me and considered in my medical decision making (see chart for details).    50 year old female presents with the above complaints.  Patient likely has acute bronchitis.  I am unsure whether she has underlying COPD.  Given tobacco abuse and prior ER visits with reported COPD exacerbation, I am treating her as I would have COPD exacerbation with prednisone, azithromycin.  I elected not to do chest x-ray and COVID testing after discussion with the patient given the fact that she has no insurance and she is concerned about the cost.  Regarding her anxiety, I am giving her Atarax.  Zofran as needed.  Advised to go to the hospital she fails improve or worsens.  Supportive care.  Final Clinical Impressions(s) / UC Diagnoses   Final diagnoses:  Acute bronchitis, unspecified organism  Anxiety state     Discharge Instructions      Medication as prescribed.  If you worsen, please go to the ER.  Take care  Dr.  Adriana Simas    ED Prescriptions     Medication Sig  Dispense Auth. Provider   predniSONE (DELTASONE) 50 MG tablet 1 tablet daily x 5 days 5 tablet Massimo Hartland G, DO   azithromycin (ZITHROMAX) 250 MG tablet 2 tablets on day 1, then 1 tablet daily on days 2-5. 6 tablet Ainsleigh Kakos G, DO   hydrOXYzine (ATARAX/VISTARIL) 25 MG tablet Take 1 tablet (25 mg total) by mouth every 8 (eight) hours as needed for anxiety. 30 tablet Duayne Brideau G, DO   ondansetron (ZOFRAN ODT) 4 MG disintegrating tablet Take 1 tablet (4 mg total) by mouth every 8 (eight) hours as needed for nausea or vomiting. 20 tablet Tommie Sams, DO      PDMP not reviewed this encounter.   Tommie Sams, [place] 11/03/20 2029

## 2020-11-03 NOTE — ED Triage Notes (Signed)
Pt presents today with c/o of cough and fever x 4 days. +anxiety

## 2020-12-05 ENCOUNTER — Emergency Department
Admission: EM | Admit: 2020-12-05 | Discharge: 2020-12-05 | Disposition: A | Discharge status: Home / Self Care | Payer: Self-pay | Attending: Emergency Medicine | Admitting: Emergency Medicine

## 2020-12-05 ENCOUNTER — Other Ambulatory Visit: Payer: Self-pay

## 2020-12-05 DIAGNOSIS — J45909 Unspecified asthma, uncomplicated: Secondary | ICD-10-CM | POA: Insufficient documentation

## 2020-12-05 DIAGNOSIS — W19XXXA Unspecified fall, initial encounter: Secondary | ICD-10-CM | POA: Insufficient documentation

## 2020-12-05 DIAGNOSIS — F17210 Nicotine dependence, cigarettes, uncomplicated: Secondary | ICD-10-CM | POA: Insufficient documentation

## 2020-12-05 DIAGNOSIS — M545 Low back pain, unspecified: Secondary | ICD-10-CM

## 2020-12-05 DIAGNOSIS — Y990 Civilian activity done for income or pay: Secondary | ICD-10-CM | POA: Insufficient documentation

## 2020-12-05 DIAGNOSIS — S39012A Strain of muscle, fascia and tendon of lower back, initial encounter: Secondary | ICD-10-CM | POA: Insufficient documentation

## 2020-12-05 MED ORDER — CYCLOBENZAPRINE HCL 10 MG PO TABS
5.0000 mg | ORAL_TABLET | Freq: Once | ORAL | Status: AC
  Administered 2020-12-05: 5 mg via ORAL
  Filled 2020-12-05: qty 1

## 2020-12-05 MED ORDER — NAPROXEN 500 MG PO TABS: 500.0000 mg | ORAL_TABLET | Freq: Two times a day (BID) | ORAL | 0 refills | Status: AC

## 2020-12-05 MED ORDER — LIDOCAINE 5 % EX PTCH
1.0000 | MEDICATED_PATCH | Freq: Once | CUTANEOUS | Status: DC
  Administered 2020-12-05: 1 via TRANSDERMAL
  Filled 2020-12-05: qty 1

## 2020-12-05 MED ORDER — CYCLOBENZAPRINE HCL 5 MG PO TABS: 5.0000 mg | ORAL_TABLET | Freq: Three times a day (TID) | ORAL | 0 refills | Status: DC | PRN

## 2020-12-05 MED ORDER — HYDROCODONE-ACETAMINOPHEN 5-325 MG PO TABS: 1.0000 | ORAL_TABLET | Freq: Three times a day (TID) | ORAL | 0 refills | Status: AC | PRN

## 2020-12-05 MED ORDER — KETOROLAC TROMETHAMINE 30 MG/ML IJ SOLN
30.0000 mg | Freq: Once | INTRAMUSCULAR | Status: AC
  Administered 2020-12-05: 30 mg via INTRAMUSCULAR
  Filled 2020-12-05: qty 1

## 2020-12-05 NOTE — ED Triage Notes (Signed)
Pt comes with c/o back pain that started few days ago. Pt state intense pain in lower back. Pt states fall at work.

## 2020-12-05 NOTE — Discharge Instructions (Addendum)
Take the pain medicines as directed.  Follow-up with your primary provider for ongoing symptoms.  Return to the ED if needed.

## 2020-12-05 NOTE — ED Provider Notes (Signed)
[facility] Emergency Department Provider Note ____________________________________________  Time seen: 1745  I have reviewed the triage vital signs and the nursing notes.  HISTORY  Chief Complaint  Back Pain   HPI Donna Howard is a 50 y.o. female presents distal to the ED for ongoing low back pain.  Patient reports a mechanical fall at work some 4 weeks prior.  She denies initial evaluation at the time of the incident, but does report a visit to [facility] in [place], about 3 weeks prior, for the same complaint.  Review of the chart, reveals a lumbar spine x-ray which was negative for any acute findings.  Patient with some underlying DDD and facet arthropathy were noted.  He presents today with ongoing back pain denies any bladder or bowel incontinence, foot drop, or saddle anesthesia.  She denies any significant benefit with over-the-counter medications including Excedrin, Tylenol, and Doan's back pills.  Denies any history of chronic ongoing back pain.  Past Medical History:  Diagnosis Date   Asthma     There are no problems to display for this patient.   Past Surgical History:  Procedure Laterality Date   TUBAL LIGATION      Prior to Admission medications   Medication Sig Start Date End Date Taking? Authorizing Provider  cyclobenzaprine (FLEXERIL) 5 MG tablet Take 1 tablet (5 mg total) by mouth 3 (three) times daily as needed. 12/05/20  Yes Taran Haynesworth, Charlesetta Ivory, PA-C  HYDROcodone-acetaminophen (NORCO) 5-325 MG tablet Take 1 tablet by mouth 3 (three) times daily as needed for up to 2 days. 12/05/20 12/07/20 Yes Vivien Barretto, Charlesetta Ivory, PA-C  naproxen (NAPROSYN) 500 MG tablet Take 1 tablet (500 mg total) by mouth 2 (two) times daily with a meal for 15 days. 12/05/20 12/20/20 Yes Cris Talavera, Charlesetta Ivory, PA-C  albuterol (PROVENTIL HFA;VENTOLIN HFA) 108 (90 BASE) MCG/ACT inhaler Inhale 2 puffs into the lungs every 6 (six) hours as needed for wheezing or  shortness of breath.    [provider]  beclomethasone (QVAR) 80 MCG/ACT inhaler Inhale 1 puff into the lungs 2 (two) times daily.    [provider]  cetirizine (ZYRTEC) 10 MG tablet Take 1 tablet (10 mg total) by mouth daily. 08/02/15   Cuthriell, Delorise Royals, PA-C  hydrOXYzine (ATARAX/VISTARIL) 25 MG tablet Take 1 tablet (25 mg total) by mouth every 8 (eight) hours as needed for anxiety. 11/03/20   Tommie Sams, DO  ondansetron (ZOFRAN ODT) 4 MG disintegrating tablet Take 1 tablet (4 mg total) by mouth every 8 (eight) hours as needed for nausea or vomiting. 11/03/20   Tommie Sams, DO    Allergies Patient has no known allergies.  History reviewed. No pertinent family history.  Social History Social History   Tobacco Use   Smoking status: Every Day    Packs/day: 0.50    Years: 24.00    Pack years: 12.00    Types: Cigarettes   Smokeless tobacco: Never  Vaping Use   Vaping Use: Never used  Substance Use Topics   Alcohol use: No    Alcohol/week: 0.0 standard drinks   Drug use: No    Review of Systems  Constitutional: Negative for fever. Eyes: Negative for visual changes. ENT: Negative for sore throat. Cardiovascular: Negative for chest pain. Respiratory: Negative for shortness of breath. Gastrointestinal: Negative for abdominal pain, vomiting and diarrhea. Genitourinary: Negative for dysuria. Musculoskeletal: Positive for back pain. Skin: Negative for rash. Neurological: Negative for headaches, focal weakness  or numbness. ____________________________________________  PHYSICAL EXAM:  VITAL SIGNS: ED Triage Vitals  Enc Vitals Group     BP 12/05/20 1647 (!) 132/113     Pulse Rate 12/05/20 1647 (!) 103     Resp 12/05/20 1647 18     Temp 12/05/20 1647 98 F (36.7 C)     Temp src --      SpO2 12/05/20 1647 95 %     Weight --      Height --      Head Circumference --      Peak Flow --      Pain Score 12/05/20 1647 10     Pain Loc --      Pain  Edu? --      Excl. in GC? --     Constitutional: Alert and oriented. Well appearing and in no distress. Head: Normocephalic and atraumatic. Eyes: Conjunctivae are normal. Normal extraocular movements Cardiovascular: Normal rate, regular rhythm. Normal distal pulses. Respiratory: Normal respiratory effort. No wheezes/rales/rhonchi. Gastrointestinal: Soft and nontender. No distention. Musculoskeletal: Normal spinal alignment without midline tenderness, spasm, deformity, or step-off.  Patient transitions from supine to sit without assistance.  She demonstrate normal lumbar flexion extension range on exam.  Patient is also able to demonstrate normal hip flexion and extension in standing position.  In the supine position is able demonstrate fluid active leg extension without hesitation.  Nontender with normal range of motion in all extremities.  Neurologic: Cranial nerves II to XII grossly intact.  Normal LE DTRs bilaterally.  Normal gait without ataxia. Normal speech and language. No gross focal neurologic deficits are appreciated. Skin:  Skin is warm, dry and intact. No rash noted. ____________________________________________   RADIOLOGY  ____________________________________________  PROCEDURES  Toradol 30 mg IM Cyclobenzaprine 5 mg PO Lidoderm 5% patch  Procedures ____________________________________________   INITIAL IMPRESSION / ASSESSMENT AND PLAN / ED COURSE  As part of my medical decision making, I reviewed the following data within the electronic MEDICAL RECORD NUMBER Radiograph reviewed NAD, Notes from prior ED visits, and [place] Controlled Substance Database     Patient ED evaluation of ongoing low back pain following mechanical fall.  Patient had a mechanical fall 4 weeks prior, had a ED evaluation about 3 weeks ago.  She presents for evaluation of her complaints with no red flags on exam.  Patient is stable at this time without neuromuscular deficit.  Review of the chart reveals  chronic changes of the lumbar spine without acute fracture or dislocation.  Patient is reporting significant improvement of her pain after ED medication administration.  She will be discharged with a prescription for naproxen, cyclobenzaprine, and a handful of hydrocodone.  She is encouraged to follow-up with her primary provider for ongoing symptoms.  Return precautions have been reviewed.    Donna Howard was evaluated in Emergency Department on 12/05/2020 for the symptoms described in the history of present illness. She was evaluated in the context of the global COVID-19 pandemic, which necessitated consideration that the patient might be at risk for infection with the SARS-CoV-2 virus that causes COVID-19. Institutional protocols and algorithms that pertain to the evaluation of patients at risk for COVID-19 are in a state of rapid change based on information released by regulatory bodies including the CDC and federal and state organizations. These policies and algorithms were followed during the patient's care in the ED. ____________________________________________  FINAL CLINICAL IMPRESSION(S) / ED DIAGNOSES  Final diagnoses:  Strain of lumbar region, initial encounter  Acute midline low back pain without sciatica      Lissa Hoard, PA-C 12/05/20 1930    Dionne Bucy, MD 12/05/20 2326

## 2021-05-18 ENCOUNTER — Emergency Department
Admission: EM | Admit: 2021-05-18 | Discharge: 2021-05-18 | Disposition: A | Discharge status: Home / Self Care | Payer: Self-pay | Attending: Emergency Medicine | Admitting: Emergency Medicine

## 2021-05-18 ENCOUNTER — Emergency Department: Payer: Self-pay

## 2021-05-18 ENCOUNTER — Other Ambulatory Visit: Payer: Self-pay

## 2021-05-18 DIAGNOSIS — Z79899 Other long term (current) drug therapy: Secondary | ICD-10-CM | POA: Insufficient documentation

## 2021-05-18 DIAGNOSIS — F17210 Nicotine dependence, cigarettes, uncomplicated: Secondary | ICD-10-CM | POA: Insufficient documentation

## 2021-05-18 DIAGNOSIS — J45909 Unspecified asthma, uncomplicated: Secondary | ICD-10-CM | POA: Insufficient documentation

## 2021-05-18 DIAGNOSIS — G8929 Other chronic pain: Secondary | ICD-10-CM

## 2021-05-18 DIAGNOSIS — M5441 Lumbago with sciatica, right side: Secondary | ICD-10-CM | POA: Insufficient documentation

## 2021-05-18 LAB — URINALYSIS, ROUTINE W REFLEX MICROSCOPIC
Bilirubin Urine: NEGATIVE
Glucose, UA: NEGATIVE mg/dL
Hgb urine dipstick: NEGATIVE
Ketones, ur: NEGATIVE mg/dL
Leukocytes,Ua: NEGATIVE
Nitrite: NEGATIVE
Protein, ur: NEGATIVE mg/dL
Specific Gravity, Urine: 1.004 — ABNORMAL LOW (ref 1.005–1.030)
pH: 6 (ref 5.0–8.0)

## 2021-05-18 MED ORDER — CYCLOBENZAPRINE HCL 10 MG PO TABS: 10.0000 mg | ORAL_TABLET | Freq: Three times a day (TID) | ORAL | 0 refills | Status: DC | PRN

## 2021-05-18 MED ORDER — HYDROCODONE-ACETAMINOPHEN 5-325 MG PO TABS
2.0000 | ORAL_TABLET | Freq: Once | ORAL | Status: AC
  Administered 2021-05-18: 21:00:00 2 via ORAL
  Filled 2021-05-18: qty 2

## 2021-05-18 NOTE — ED Provider Notes (Signed)
[facility] Emergency Department Provider Note ____________________________________________   Event Date/Time   First MD Initiated Contact with Patient 05/18/21 2002     (approximate)  I have reviewed the triage vital signs and the nursing notes.  HISTORY  Chief Complaint Back Pain  HPI Donna Howard is a 50 y.o. female who presents for lumbar back pain  LOCATION: Bilateral lower lumbar region paraspinal musculature DURATION: 6 months prior to arrival TIMING: Worsened over the last 3 days SEVERITY: 10/10 QUALITY: Aching CONTEXT: Patient states that she simply woke up with this back pain 3 days ago that was hurting worse than usual.  Patient was seen 6 months prior to arrival for similar pain and discharged on Norco/Flexeril.  Patient is requesting refills of these medications as well as a injection in her back. MODIFYING FACTORS: Any movement worsens this pain and she denies any relieving factors ASSOCIATED SYMPTOMS: Right-sided shooting paresthesias   Per medical record review, patient has history of of asthma          Past Medical History:  Diagnosis Date   Asthma     There are no problems to display for this patient.   Past Surgical History:  Procedure Laterality Date   TUBAL LIGATION      Prior to Admission medications   Medication Sig Start Date End Date Taking? Authorizing Provider  cyclobenzaprine (FLEXERIL) 10 MG tablet Take 1 tablet (10 mg total) by mouth 3 (three) times daily as needed for muscle spasms. 05/18/21  Yes Merwyn Katos, MD  albuterol (PROVENTIL HFA;VENTOLIN HFA) 108 (90 BASE) MCG/ACT inhaler Inhale 2 puffs into the lungs every 6 (six) hours as needed for wheezing or shortness of breath.    [provider]  beclomethasone (QVAR) 80 MCG/ACT inhaler Inhale 1 puff into the lungs 2 (two) times daily.    [provider]  cetirizine (ZYRTEC) 10 MG tablet Take 1 tablet (10 mg total) by mouth daily. 08/02/15    Cuthriell, Delorise Royals, PA-C  hydrOXYzine (ATARAX/VISTARIL) 25 MG tablet Take 1 tablet (25 mg total) by mouth every 8 (eight) hours as needed for anxiety. 11/03/20   Tommie Sams, DO  ondansetron (ZOFRAN ODT) 4 MG disintegrating tablet Take 1 tablet (4 mg total) by mouth every 8 (eight) hours as needed for nausea or vomiting. 11/03/20   Tommie Sams, DO    Allergies Patient has no known allergies.  No family history on file.  Social History Social History   Tobacco Use   Smoking status: Every Day    Packs/day: 0.50    Years: 24.00    Pack years: 12.00    Types: Cigarettes   Smokeless tobacco: Never  Vaping Use   Vaping Use: Never used  Substance Use Topics   Alcohol use: No    Alcohol/week: 0.0 standard drinks   Drug use: No    Review of Systems Constitutional: No fever/chills Eyes: No visual changes. ENT: No sore throat. Cardiovascular: Denies chest pain. Respiratory: Denies shortness of breath. Gastrointestinal: No abdominal pain.  No nausea, no vomiting.  No diarrhea. Genitourinary: Negative for dysuria. Musculoskeletal: Positive for chronic low back pain Skin: Negative for rash. Neurological: Negative for headaches, weakness/numbness/paresthesias in any extremity Psychiatric: Negative for suicidal ideation/homicidal ideation   ____________________________________________   PHYSICAL EXAM:  VITAL SIGNS: ED Triage Vitals  Enc Vitals Group     BP 05/18/21 1840 110/83     Pulse Rate 05/18/21 1840 84     Resp 05/18/21  1840 18     Temp 05/18/21 1840 (!) 97.5 F (36.4 C)     Temp Source 05/18/21 1840 Oral     SpO2 05/18/21 1840 96 %     Weight 05/18/21 1845 125 lb (56.7 kg)     Height 05/18/21 1845 5\' 7"  (1.702 m)     Head Circumference --      Peak Flow --      Pain Score 05/18/21 1845 10     Pain Loc --      Pain Edu? --      Excl. in [place]? --    Constitutional: Alert and oriented. Well appearing and in no acute distress. Eyes: Conjunctivae are normal.  PERRL. Head: Atraumatic. Nose: No congestion/rhinnorhea. Mouth/Throat: Mucous membranes are moist. Neck: No stridor Cardiovascular: Grossly normal heart sounds.  Good peripheral circulation. Respiratory: Normal respiratory effort.  No retractions. Gastrointestinal: Soft and nontender. No distention. Musculoskeletal: No obvious deformities.  Tender to palpation in bilateral lower lumbar regions Neurologic:  Normal speech and language. No gross focal neurologic deficits are appreciated. Skin:  Skin is warm and dry. No rash noted. Psychiatric: Mood and affect are normal. Speech and behavior are normal.  ____________________________________________   LABS (all labs ordered are listed, but only abnormal results are displayed)  Labs Reviewed  URINALYSIS, ROUTINE W REFLEX MICROSCOPIC - Abnormal; Notable for the following components:      Result Value   Color, Urine YELLOW (*)    APPearance CLOUDY (*)    Specific Gravity, Urine 1.004 (*)    All other components within normal limits   ____________________________________________ RADIOLOGY  ED MD interpretation: 3 view x-ray of the spine shows no acute/traumatic lumbar spine pathology  Official radiology report(s): DG Lumbar Spine 2-3 Views  Result Date: 05/18/2021 CLINICAL DATA:  Low back pain. EXAM: LUMBAR SPINE - 2-3 VIEW COMPARISON:  None. FINDINGS: Five lumbar type vertebra. There is no acute fracture subluxation of the lumbar spine. Mild degenerative changes and spurring. Lower lumbar facet arthropathy. The visualized posterior elements are intact. The soft tissues are unremarkable. IMPRESSION: No acute/traumatic lumbar spine pathology. Electronically Signed   By: 05/20/2021 M.D.   On: 05/18/2021 19:45    ____________________________________________   PROCEDURES  Procedure(s) performed (including Critical Care):  Procedures   ____________________________________________   INITIAL IMPRESSION / ASSESSMENT AND PLAN /  ED COURSE  As part of my medical decision making, I reviewed the following data within the electronic medical record, if available:  Nursing notes reviewed and incorporated, Labs reviewed, EKG interpreted, Old chart reviewed, Radiograph reviewed and Notes from prior ED visits reviewed and incorporated        Patient presents for low back pain. Given History and Exam the patient appears to be at low risk for Spinal Cord Compression Syndrome, Vertebral Malignancy/Mets, acute Spinal Fracture, Vertebral Osteomyelitis, Epidural Abscess, Infected or Obstructing Kidney Stone.  Their presentation appears most likely to be secondary to non-emergent musculoskeletal etiology vs non-emergent disc herniation.  ED Workup: X-ray of the lumbar region does not show any evidence of acute abnormalities  Spoke to patient at length about the importance of pursuing medical insurance in order to follow-up with the orthopedic surgeons for definitive management of her back pain with sciatica.  Patient was given a refill of the Flexeril but was told that we do not use opiates for low back pain in the long-term and can follow-up with orthopedics for a low back steroid injection.  Disposition: Discharge. Strict return precautions discussed  with patient with full understanding. Advised patient to follow up promptly with primary care provider      ____________________________________________   FINAL CLINICAL IMPRESSION(S) / ED DIAGNOSES  Final diagnoses:  Chronic bilateral low back pain with right-sided sciatica     ED Discharge Orders          Ordered    cyclobenzaprine (FLEXERIL) 10 MG tablet  3 times daily PRN        05/18/21 2020             Note:  This document was prepared using Dragon voice recognition software and may include unintentional dictation errors.    Merwyn Katos, MD 05/18/21 2035

## 2021-05-18 NOTE — ED Triage Notes (Signed)
Pt here with back pain for a few weeks. Pt has tried hot packs and medication with no relief. Pt in NAD in triage.

## 2021-06-26 ENCOUNTER — Other Ambulatory Visit: Payer: Self-pay

## 2021-06-26 ENCOUNTER — Emergency Department
Admission: EM | Admit: 2021-06-26 | Discharge: 2021-06-26 | Disposition: A | Discharge status: Home / Self Care | Payer: Self-pay | Attending: Emergency Medicine | Admitting: Emergency Medicine

## 2021-06-26 DIAGNOSIS — M5441 Lumbago with sciatica, right side: Secondary | ICD-10-CM | POA: Insufficient documentation

## 2021-06-26 DIAGNOSIS — M5431 Sciatica, right side: Secondary | ICD-10-CM

## 2021-06-26 MED ORDER — KETOROLAC TROMETHAMINE 30 MG/ML IJ SOLN
30.0000 mg | Freq: Once | INTRAMUSCULAR | Status: AC
  Administered 2021-06-26: 30 mg via INTRAMUSCULAR
  Filled 2021-06-26: qty 1

## 2021-06-26 MED ORDER — HYDROCODONE-ACETAMINOPHEN 5-325 MG PO TABS
1.0000 | ORAL_TABLET | Freq: Once | ORAL | Status: AC
  Administered 2021-06-26: 1 via ORAL
  Filled 2021-06-26: qty 1

## 2021-06-26 MED ORDER — HYDROCODONE-ACETAMINOPHEN 5-325 MG PO TABS: 1.0000 | ORAL_TABLET | Freq: Four times a day (QID) | ORAL | 0 refills | Status: DC | PRN

## 2021-06-26 MED ORDER — NAPROXEN 500 MG PO TABS: 500.0000 mg | ORAL_TABLET | Freq: Two times a day (BID) | ORAL | 2 refills | Status: DC

## 2021-06-26 MED ORDER — CYCLOBENZAPRINE HCL 10 MG PO TABS: 10.0000 mg | ORAL_TABLET | Freq: Three times a day (TID) | ORAL | 0 refills | Status: DC | PRN

## 2021-06-26 NOTE — ED Provider Notes (Signed)
[facility] Provider Note    Event Date/Time   First MD Initiated Contact with Patient 06/26/21 1412     (approximate)   History   Back Pain   HPI  Donna Howard is a 51 y.o. female who reports a history of back pain including sciatica presents with complaints of low back pain radiating into her right buttocks.  No weakness in the leg but occasional electrical sensations.  No abdominal pain.  No fevers or chills.  Reports relief in the past from medications prescribed in the emergency department.     Physical Exam   Triage Vital Signs: ED Triage Vitals  Enc Vitals Group     BP 06/26/21 1338 123/66     Pulse Rate 06/26/21 1338 (!) 109     Resp 06/26/21 1338 18     Temp 06/26/21 1338 98.5 F (36.9 C)     Temp Source 06/26/21 1338 Oral     SpO2 06/26/21 1338 97 %     Weight 06/26/21 1337 56.7 kg (125 lb)     Height 06/26/21 1337 1.702 m (5\' 7" )     Head Circumference --      Peak Flow --      Pain Score 06/26/21 1337 10     Pain Loc --      Pain Edu? --      Excl. in [place]? --     Most recent vital signs: Vitals:   06/26/21 1338  BP: 123/66  Pulse: (!) 109  Resp: 18  Temp: 98.5 F (36.9 C)  SpO2: 97%     General: Awake, no distress.  CV:  Good peripheral perfusion.  Resp:  Normal effort.  Abd:  No distention.  Other:  Back: No vertebral tenderness or swelling or redness.  Mild paraspinal right lumbar tenderness to palpation, normal strength in the lower extremities.   ED Results / Procedures / Treatments   Labs (all labs ordered are listed, but only abnormal results are displayed) Labs Reviewed - No data to display   EKG     RADIOLOGY     PROCEDURES:  Critical Care performed:   Procedures   MEDICATIONS ORDERED IN ED: Medications  ketorolac (TORADOL) 30 MG/ML injection 30 mg (30 mg Intramuscular Given 06/26/21 1438)  HYDROcodone-acetaminophen (NORCO/VICODIN) 5-325 MG per tablet 1 tablet (1 tablet Oral Given 06/26/21  1438)     IMPRESSION / MDM / ASSESSMENT AND PLAN / ED COURSE  I reviewed the triage vital signs and the nursing notes.  Patient with a history of back pain presents with likely right-sided sciatica.  No neurodeficits, no fevers or concerning symptoms.  Will treat with IM Toradol here in the emergency department  Medical records reviewed, outside ED encounter on May 19, 2021 for similar complaints   Will prescribe muscle relaxers, Naprosyn, short course of Vicodin          FINAL CLINICAL IMPRESSION(S) / ED DIAGNOSES   Final diagnoses:  Sciatica of right side     Rx / DC Orders   ED Discharge Orders          Ordered    HYDROcodone-acetaminophen (NORCO/VICODIN) 5-325 MG tablet  Every 6 hours PRN        06/26/21 1449    cyclobenzaprine (FLEXERIL) 10 MG tablet  3 times daily PRN        06/26/21 1449    naproxen (NAPROSYN) 500 MG tablet  2 times daily with meals  06/26/21 1449             Note:  This document was prepared using Dragon voice recognition software and may include unintentional dictation errors.   Lavonia Drafts, MD 06/26/21 [phone]

## 2021-06-26 NOTE — ED Notes (Signed)
See triage note  presents with lower back pain for couple of days  pain radiates into right leg

## 2021-06-26 NOTE — ED Triage Notes (Signed)
Pt states that she is having low back pain that radiates down her R leg- pt states it got worse on Friday- pt states she has been told it was sciatica

## 2021-06-26 NOTE — ED Notes (Signed)
E sig pad not working, verbal consent for discharge.

## 2021-07-27 ENCOUNTER — Other Ambulatory Visit: Payer: Self-pay

## 2021-07-27 ENCOUNTER — Emergency Department
Admission: EM | Admit: 2021-07-27 | Discharge: 2021-07-27 | Disposition: A | Discharge status: Home / Self Care | Payer: Self-pay | Attending: Emergency Medicine | Admitting: Emergency Medicine

## 2021-07-27 DIAGNOSIS — M5441 Lumbago with sciatica, right side: Secondary | ICD-10-CM | POA: Insufficient documentation

## 2021-07-27 DIAGNOSIS — G8929 Other chronic pain: Secondary | ICD-10-CM | POA: Insufficient documentation

## 2021-07-27 MED ORDER — LIDOCAINE 5 % EX PTCH: 1.0000 | MEDICATED_PATCH | Freq: Two times a day (BID) | CUTANEOUS | 0 refills | Status: DC

## 2021-07-27 MED ORDER — KETOROLAC TROMETHAMINE 30 MG/ML IJ SOLN
30.0000 mg | Freq: Once | INTRAMUSCULAR | Status: AC
Start: 2021-07-27 — End: 2021-07-27
  Administered 2021-07-27: 30 mg via INTRAMUSCULAR
  Filled 2021-07-27: qty 1

## 2021-07-27 MED ORDER — NAPROXEN 500 MG PO TABS: 500.0000 mg | ORAL_TABLET | Freq: Two times a day (BID) | ORAL | 0 refills | Status: DC

## 2021-07-27 MED ORDER — HYDROCODONE-ACETAMINOPHEN 5-325 MG PO TABS
1.0000 | ORAL_TABLET | Freq: Four times a day (QID) | ORAL | 0 refills | Status: DC | PRN
Start: 2021-07-27 — End: 2021-08-25

## 2021-07-27 MED ORDER — CYCLOBENZAPRINE HCL 5 MG PO TABS: 5.0000 mg | ORAL_TABLET | Freq: Three times a day (TID) | ORAL | 0 refills | Status: DC | PRN

## 2021-07-27 MED ORDER — LIDOCAINE 5 % EX PTCH
1.0000 | MEDICATED_PATCH | CUTANEOUS | Status: DC
  Administered 2021-07-27: 1 via TRANSDERMAL
  Filled 2021-07-27: qty 1

## 2021-07-27 MED ORDER — HYDROCODONE-ACETAMINOPHEN 5-325 MG PO TABS
1.0000 | ORAL_TABLET | Freq: Once | ORAL | Status: AC
Start: 2021-07-27 — End: 2021-07-27
  Administered 2021-07-27: 1 via ORAL
  Filled 2021-07-27: qty 1

## 2021-07-27 NOTE — Discharge Instructions (Signed)
Keep your appointment with the [facility] on March 22. ?Pain medication was sent to the pharmacy to be taken only as needed. ?The naproxen you will need to take daily to help with inflammation and reduce pain.  You may also use ice or heat to your back as needed for discomfort. ?

## 2021-07-27 NOTE — ED Provider Notes (Signed)
? ?[facility] ?Provider Note ? ? ? Event Date/Time  ? First MD Initiated Contact with Patient 07/27/21 [phone]   ?  (approximate) ? ? ?History  ? ?Back Pain ? ? ?HPI ? ?Donna Howard is a 51 y.o. female   presents to the ED with complaint of low back pain with right sciatica.  Patient has a history of the same and has been seen at [facility] and [facility].  She denies any recent injury.  She states that she has an appointment at the [facility] on 08/10/2021 for further evaluation of her low back pain.  Patient states that this is no different than her normal sciatic type pain but she can "no longer take the pain".  Currently she rates her pain as a 10/10.  She also reports that she cannot take steroids as they cause palpitations. ? ?  ? ? ?Physical Exam  ? ?Triage Vital Signs: ?ED Triage Vitals  ?Enc Vitals Group  ?   BP 07/27/21 0859 139/86  ?   Pulse Rate 07/27/21 0859 86  ?   Resp 07/27/21 0859 19  ?   Temp 07/27/21 0859 97.7 ?F (36.5 ?C)  ?   Temp Source 07/27/21 0859 Oral  ?   SpO2 07/27/21 0859 94 %  ?   Weight 07/27/21 0855 125 lb (56.7 kg)  ?   Height 07/27/21 0855 5\' 7"  (1.702 m)  ?   Head Circumference --   ?   Peak Flow --   ?   Pain Score 07/27/21 0855 10  ?   Pain Loc --   ?   Pain Edu? --   ?   Excl. in [place]? --   ? ? ?Most recent vital signs: ?Vitals:  ? 07/27/21 0859  ?BP: 139/86  ?Pulse: 86  ?Resp: 19  ?Temp: 97.7 ?F (36.5 ?C)  ?SpO2: 94%  ? ? ? ?General: Awake, no distress.  Alert, talkative, slow ambulation. ?CV:  Good peripheral perfusion.  ?Resp:  Normal effort.  ?Abd:  No distention.  Soft, nontender, flat, bowel sounds normoactive x4 quadrants. ?Other:  Patient is able to stand and ambulate without any assistance.  Good muscle strength bilaterally at 5/5.  Reflexes were 2+ bilaterally.  Straight leg raises were restricted secondary to increased pain at approximately 20 degrees.  There is lower lumbar and right sacral pain especially palpation of the SI joint on the right  side. ? ? ?ED Results / Procedures / Treatments  ? ?Labs ?(all labs ordered are listed, but only abnormal results are displayed) ?Labs Reviewed - No data to display ? ? ? ? ?PROCEDURES: ? ?Critical Care performed:  ? ?Procedures ? ? ?MEDICATIONS ORDERED IN ED: ?Medications  ?lidocaine (LIDODERM) 5 % 1 patch (1 patch Transdermal Patch Applied 07/27/21 1008)  ?ketorolac (TORADOL) 30 MG/ML injection 30 mg (30 mg Intramuscular Given 07/27/21 1009)  ?HYDROcodone-acetaminophen (NORCO/VICODIN) 5-325 MG per tablet 1 tablet (1 tablet Oral Given 07/27/21 1043)  ? ? ? ?IMPRESSION / MDM / ASSESSMENT AND PLAN / ED COURSE  ?I reviewed the triage vital signs and the nursing notes. ? ? ?Differential diagnosis includes, but is not limited to, low back pain, muscle strain, chronic low back pain with right leg sciatica. ? ?51 year old female presents to the ED with complaint of low back pain with radiation into her right leg.  Patient has history of sciatica and has been seen in multiple emergency departments but has not followed up with a PCP.  She reports that she does have an appointment for [facility] on [facility]21.  Patient states that the medication that she was given here in the emergency department the last time helped a great deal.  She is also requesting Flexeril.  Patient was given Toradol 30 mg IM and hydrocodone while in the emergency department.  Patient was discharged with a Lidoderm patch, Flexeril, naproxen 500 mg twice daily and a small amount of hydrocodone.  She is encouraged to use ice or heat to her back as needed for discomfort.  She also was encouraged to keep her appointment on the 22nd. ? ? ? ?FINAL CLINICAL IMPRESSION(S) / ED DIAGNOSES  ? ?Final diagnoses:  ?Chronic right-sided low back pain with right-sided sciatica  ? ? ? ?Rx / DC Orders  ? ?ED Discharge Orders   ? ?      Ordered  ?  naproxen (NAPROSYN) 500 MG tablet  2 times daily with meals       ? 07/27/21 1113  ?  HYDROcodone-acetaminophen  (NORCO/VICODIN) 5-325 MG tablet  Every 6 hours PRN       ? 07/27/21 1113  ?  lidocaine (LIDODERM) 5 %  Every 12 hours       ? 07/27/21 1115  ?  cyclobenzaprine (FLEXERIL) 5 MG tablet  3 times daily PRN       ? 07/27/21 1128  ? ?  ?  ? ?  ? ? ? ?Note:  This document was prepared using Dragon voice recognition software and may include unintentional dictation errors. ?  ?Tommi Rumps, PA-C ?07/27/21 1515 ? ?  ?Minna Antis, MD ?07/28/21 0750 ? ?

## 2021-07-27 NOTE — ED Triage Notes (Signed)
Pt c/o lower back pain radiating into the right leg for the past 2 days, has a hx of sciatica  ?

## 2021-08-10 ENCOUNTER — Ambulatory Visit: Payer: Self-pay | Admitting: Gerontology

## 2021-08-10 ENCOUNTER — Encounter: Payer: Self-pay | Admitting: Gerontology

## 2021-08-10 ENCOUNTER — Other Ambulatory Visit: Payer: Self-pay

## 2021-08-10 VITALS — BP 106/71 | HR 90 | Temp 97.8°F | Resp 16 | Ht 66.0 in | Wt 142.5 lb

## 2021-08-10 DIAGNOSIS — F419 Anxiety disorder, unspecified: Secondary | ICD-10-CM

## 2021-08-10 DIAGNOSIS — Z7689 Persons encountering health services in other specified circumstances: Secondary | ICD-10-CM | POA: Insufficient documentation

## 2021-08-10 DIAGNOSIS — F17200 Nicotine dependence, unspecified, uncomplicated: Secondary | ICD-10-CM | POA: Insufficient documentation

## 2021-08-10 DIAGNOSIS — Z8709 Personal history of other diseases of the respiratory system: Secondary | ICD-10-CM | POA: Insufficient documentation

## 2021-08-10 DIAGNOSIS — G8929 Other chronic pain: Secondary | ICD-10-CM | POA: Insufficient documentation

## 2021-08-10 MED ORDER — CYCLOBENZAPRINE HCL 5 MG PO TABS: 5.0000 mg | ORAL_TABLET | Freq: Three times a day (TID) | ORAL | 0 refills | Status: DC | PRN

## 2021-08-10 MED ORDER — ALBUTEROL SULFATE HFA 108 (90 BASE) MCG/ACT IN AERS
2.0000 | INHALATION_SPRAY | Freq: Four times a day (QID) | RESPIRATORY_TRACT | 4 refills | Status: AC | PRN
  Filled 2021-08-10: qty 6.7, 25d supply, fill #0

## 2021-08-10 MED ORDER — GABAPENTIN 100 MG PO CAPS
100.0000 mg | ORAL_CAPSULE | Freq: Two times a day (BID) | ORAL | 0 refills | Status: DC
Start: 2021-08-10 — End: 2021-08-10
  Filled 2021-08-10: qty 30, 15d supply, fill #0

## 2021-08-10 MED ORDER — GABAPENTIN 100 MG PO CAPS: 100.0000 mg | ORAL_CAPSULE | Freq: Two times a day (BID) | ORAL | 0 refills | Status: DC

## 2021-08-10 MED ORDER — LIDOCAINE 5 % EX PTCH
1.0000 | MEDICATED_PATCH | Freq: Two times a day (BID) | CUTANEOUS | 0 refills | Status: DC
  Filled 2021-08-10: qty 10, 10d supply, fill #0

## 2021-08-10 NOTE — Patient Instructions (Signed)

## 2021-08-10 NOTE — Progress Notes (Signed)
? ?New Patient Office Visit ? ?Subjective:  ?Patient ID: Donna Howard, female    DOB: April 02, 1971  Age: 51 y.o. MRN: [id number] ? ?CC:  ?Chief Complaint  ?Patient presents with  ? Establish Care  ? Back Pain  ?  Seen at [facility] ED  ? grief  ?  Patient lost "the love of her life" on her birthday 04/04/21  ? ? ?HPI ?Donna Howard is a 51 year old female who has history of Asthma, back pain presents to establish care and evaluation of her chronic problem. She states that her breathing is stable and uses rescue inhaler sparingly.  She smokes 1 pack of cigarette daily and denies the desire to quit. She reports experiencing constant lower back pain that started 6 months ago. She states that pain originates from her lower back and radiates to her right leg, with periods of numbness. She describes pain as dull 8/10. She denies bladder nor bowel incontinence and saddle anesthesia. 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 She denies any aggravating factor and she has taking multiple otc analgesic tablets and icy patches with minimal relief. She reports that taking cyclobenzaprine  moderately relieves symptoms. She was dysphoric, teary, states that she's been experiencing panic attacks and anxiety after the death of the  " the love of her life' on her birthday 04/04/21. She denies suicidal nor homicidal ideation. Overall, she states that she's concerned about her back pain and requests Hydrocodone to be refilled, she offers no further complaint. ? ?Past Medical History:  ?Diagnosis Date  ? Asthma   ? ? ?Past Surgical History:  ?Procedure Laterality Date  ? TUBAL LIGATION    ? ? ?Family History  ?Problem Relation Age of Onset  ? Hyperlipidemia Mother   ? Hypertension Mother   ? Diabetes Mother   ?     pre-diabetes  ? Other Father   ?     accidental electrocution  ? Hypertension Brother   ? Diabetes Brother   ? Heart attack Maternal Grandmother   ? Other Maternal Grandfather   ?     unknown medical history  ?  Diabetes Paternal Grandmother   ? Heart attack Paternal Grandfather   ? ? ?Social History  ? ?Socioeconomic History  ? Marital status: Single  ?  Spouse name: Not on file  ? Number of children: Not on file  ? Years of education: Not on file  ? Highest education level: Not on file  ?Occupational History  ? Not on file  ?Tobacco Use  ? Smoking status: Every Day  ?  Packs/day: 1.00  ?  Years: 24.00  ?  Pack years: 24.00  ?  Types: Cigarettes  ? Smokeless tobacco: Never  ?Vaping Use  ? Vaping Use: Never used  ?Substance and Sexual Activity  ? Alcohol use: Yes  ?  Comment: occassionally  ? Drug use: No  ? Sexual activity: Not on file  ?Other Topics Concern  ? Not on file  ?Social History Narrative  ? Not on file  ? ?Social Determinants of Health  ? ?Financial Resource Strain: Not on file  ?Food Insecurity: No Food Insecurity  ? Worried About Charity fundraiser in the Last Year: Never true  ? Ran Out of Food in the Last Year: Never true  ?Transportation Needs: No Transportation Needs  ? Lack of Transportation (Medical): No  ? Lack of Transportation (Non-Medical): No  ?Physical Activity: Not on file  ?Stress:  Not on file  ?Social Connections: Not on file  ?Intimate Partner Violence: Not on file  ? ? ?ROS ?Review of Systems  ?Constitutional: Negative.   ?HENT: Negative.    ?Eyes: Negative.   ?Respiratory: Negative.    ?Cardiovascular: Negative.   ?Gastrointestinal: Negative.   ?Endocrine: Negative.   ?Genitourinary: Negative.   ?Musculoskeletal:  Positive for back pain (lower back pain).  ?Skin: Negative.   ?Psychiatric/Behavioral:  Positive for dysphoric mood.   ? ?Objective:  ? ?Today's Vitals: BP 106/71 (BP Location: Right Arm, Patient Position: Sitting, Cuff Size: Large)   Pulse 90   Temp 97.8 ?F (36.6 ?C) (Oral)   Resp 16   Ht 5' 6" (1.676 m)   Wt 142 lb 8 oz (64.6 kg)   LMP 08/03/2021 (Approximate)   SpO2 97%   BMI 23.00 kg/m?  ? ?Physical Exam ?HENT:  ?   Head: Normocephalic and atraumatic.  ?   Nose: Nose  normal.  ?   Mouth/Throat:  ?   Mouth: Mucous membranes are moist.  ?Eyes:  ?   General:     ?   Right eye: No discharge.  ?   Extraocular Movements: Extraocular movements intact.  ?   Conjunctiva/sclera: Conjunctivae normal.  ?   Pupils: Pupils are equal, round, and reactive to light.  ?Cardiovascular:  ?   Rate and Rhythm: Normal rate and regular rhythm.  ?   Pulses: Normal pulses.  ?   Heart sounds: Normal heart sounds.  ?Pulmonary:  ?   Effort: Pulmonary effort is normal.  ?   Breath sounds: Normal breath sounds.  ?Abdominal:  ?   General: Abdomen is flat. Bowel sounds are normal.  ?   Palpations: Abdomen is soft.  ?Musculoskeletal:     ?   General: Tenderness (with palpation to right lower back) present.  ?   Cervical back: Normal range of motion.  ?Skin: ?   General: Skin is warm.  ?Neurological:  ?   General: No focal deficit present.  ?   Mental Status: She is alert and oriented to person, place, and time. Mental status is at baseline.  ?Psychiatric:     ?   Mood and Affect: Mood normal. Affect is tearful.     ?   Speech: Speech normal.     ?   Behavior: Behavior normal. Behavior is cooperative.  ? ? ?Assessment & Plan:  ? ? ? ?1. Encounter to establish care ?-Routine labs will be checked ?- CBC w/Diff; Future ?- Comp Met (CMET); Future ?- Lipid panel; Future ?- HgB A1c; Future ?- HgB A1c ?- Lipid panel ?- Comp Met (CMET) ?- CBC w/Diff ? ?2. History of asthma ?- Her breathing is stable, she was encouraged on smoking cessation. ?- albuterol (PROVENTIL HFA) 108 (90 Base) MCG/ACT inhaler; Inhale 2 puffs into the lungs once every 6 (six) hours as needed for wheezing or shortness of breath.  Dispense: 6.7 g; Refill: 4 ? ?3. Chronic right-sided low back pain, unspecified whether sciatica present ?- She declined Physical Therapy, and will continue on Flexeril for 2 more weeks, was started on Gabapentin , educated on medication side effects and advised to notify clinic. She was referred to the Pain clinic. ?-  lidocaine (LIDODERM) 5 %; Place 1 patch onto the skin every 12 (twelve) hours. Remove and discard patch within 12 hours or as directed by doctor. (Max of 1 patch in 24 hours).  Dispense: 10 patch; Refill: 0 ?- Ambulatory referral to Pain  Clinic ?- cyclobenzaprine (FLEXERIL) 5 MG tablet; Take 1 tablet (5 mg total) by mouth 3 (three) times daily as needed for muscle spasms.  Dispense: 15 tablet; Refill: 0 ?- gabapentin (NEURONTIN) 100 MG capsule; Take 1 capsule (100 mg total) by mouth 2 (two) times daily.  Dispense: 30 capsule; Refill: 0 ? ?4. Anxiety ?- She will follow up at [facility] behavioral clinic and was advised to call the Crisis help line with worsening symptoms. ? ?5. Smoking ?- She was encouraged on smoking cessation. ? ? ? ?Follow-up: Return in about 2 weeks (around 08/24/2021), or if symptoms worsen or fail to improve.  ? ?Chioma Jerold Coombe, NP ? ?

## 2021-08-16 ENCOUNTER — Institutional Professional Consult (permissible substitution): Payer: Self-pay | Admitting: Licensed Clinical Social Worker

## 2021-08-16 ENCOUNTER — Telehealth: Payer: Self-pay | Admitting: Licensed Clinical Social Worker

## 2021-08-16 NOTE — Telephone Encounter (Signed)
Called the patient twice during today's scheduled appointment; no answer, left a voicemail with the clinic contact information so they may reschedule.   

## 2021-08-17 ENCOUNTER — Ambulatory Visit: Payer: Self-pay | Admitting: Pharmacy Technician

## 2021-08-17 DIAGNOSIS — Z79899 Other long term (current) drug therapy: Secondary | ICD-10-CM

## 2021-08-17 NOTE — Progress Notes (Signed)
Patient stated that she has been approved for charity care with [facility].  Patient stated that she is experiencing a lot of back pain.  She went to [facility] in [place] on 3/28 for back pain.  Was prescribed a narcotic. Explained that [facility] can't provide medication assistance for narcotics but could fill all her other medications.  Patient stated that she currently only needs pain medication.  Patient stated that she is not in need of an inhaler.  Is using an inhaler that was obtained from a family member. Prefers to purchase allergy medication over-the-counter. Going forward will obtain all medications from [facility] instead of [facility].  Patient to notify [facility] if she decides to obtain her medications at [facility]. ? ?Assisted patient with making an appointment with [facility] in [place].  Appointment is 8/10 at 8:00a.m. ? ?Assisted patient with making an appointment with [facility].  Appointment is 6/15 at 3:00p.m. ? ?Sherilyn Dacosta ?Care Manager ?[facility] ? ? ? ? ?

## 2021-08-25 ENCOUNTER — Ambulatory Visit: Payer: Self-pay | Admitting: Gerontology

## 2021-08-25 VITALS — BP 92/60 | HR 85 | Temp 98.0°F | Resp 18 | Ht 66.0 in | Wt 141.8 lb

## 2021-08-25 DIAGNOSIS — M545 Low back pain, unspecified: Secondary | ICD-10-CM

## 2021-08-25 DIAGNOSIS — Z7689 Persons encountering health services in other specified circumstances: Secondary | ICD-10-CM

## 2021-08-25 MED ORDER — GABAPENTIN 100 MG PO CAPS: 100.0000 mg | ORAL_CAPSULE | Freq: Two times a day (BID) | ORAL | 0 refills | Status: AC

## 2021-08-25 NOTE — Progress Notes (Signed)
? ?Established Patient Office Visit ? ?Subjective:  ?Patient ID: Donna Howard, female    DOB: 1970-08-17  Age: 51 y.o. MRN: [id number] ? ?CC:  ?Chief Complaint  ?Patient presents with  ? Follow-up  ?  Las drawn 08/10/21  ? ? ?HPI ?Donna Howard  is a 51 year old female who has history of Asthma, back pain presents for follow up visit. She was seen at [facility] ED on 08/16/21 for chronic right sided lower back pain , was treated with Cyclobenzaprine 10 mg tid for muscle spasms, hydrocodone-Acetaminophen 5-325 mg and will follow up with Dr Armanda Magic at the [place], on 11/03/21 and Celestia Khat FNP family medicine on 12/29/21. Currently, she states that she continues to experience constant  dull 7/10 pain to her right lower back that radiates to her right leg. She states that taking Flexeril moderately relieves her pain. She denies bladder/bowel incontinence and saddle anesthesia. She states that standing,and sitting aggravates symptoms. Overall, she's concerned about her back pain. ? ?Past Medical History:  ?Diagnosis Date  ? Asthma   ? ? ?Past Surgical History:  ?Procedure Laterality Date  ? TUBAL LIGATION    ? ? ?Family History  ?Problem Relation Age of Onset  ? Hyperlipidemia Mother   ? Hypertension Mother   ? Diabetes Mother   ?     pre-diabetes  ? Other Father   ?     accidental electrocution  ? Hypertension Brother   ? Diabetes Brother   ? Heart attack Maternal Grandmother   ? Other Maternal Grandfather   ?     unknown medical history  ? Diabetes Paternal Grandmother   ? Heart attack Paternal Grandfather   ? ? ?Social History  ? ?Socioeconomic History  ? Marital status: Single  ?  Spouse name: Not on file  ? Number of children: Not on file  ? Years of education: Not on file  ? Highest education level: Not on file  ?Occupational History  ? Not on file  ?Tobacco Use  ? Smoking status: Every Day  ?  Packs/day: 1.00  ?  Years: 24.00  ?  Pack years: 24.00  ?  Types: Cigarettes  ? Smokeless tobacco: Never  ?Vaping Use  ?  Vaping Use: Never used  ?Substance and Sexual Activity  ? Alcohol use: Yes  ?  Comment: occassionally  ? Drug use: No  ? Sexual activity: Not on file  ?Other Topics Concern  ? Not on file  ?Social History Narrative  ? Not on file  ? ?Social Determinants of Health  ? ?Financial Resource Strain: Not on file  ?Food Insecurity: No Food Insecurity  ? Worried About Charity fundraiser in the Last Year: Never true  ? Ran Out of Food in the Last Year: Never true  ?Transportation Needs: No Transportation Needs  ? Lack of Transportation (Medical): No  ? Lack of Transportation (Non-Medical): No  ?Physical Activity: Not on file  ?Stress: Not on file  ?Social Connections: Not on file  ?Intimate Partner Violence: Not on file  ? ? ?Outpatient Medications Prior to Visit  ?Medication Sig Dispense Refill  ? albuterol (PROVENTIL HFA) 108 (90 Base) MCG/ACT inhaler Inhale 2 puffs into the lungs once every 6 (six) hours as needed for wheezing or shortness of breath. 6.7 g 4  ? Aspirin-Acetaminophen (GOODYS BACK & BODY PAIN) 500-325 MG PACK Take by mouth. Taking 1 to 10 daily prn back pain    ? cetirizine (ZYRTEC) 10  MG tablet Take 1 tablet (10 mg total) by mouth daily. 30 tablet 0  ? cyclobenzaprine (FLEXERIL) 10 MG tablet Take 10 mg by mouth 3 (three) times daily as needed.    ? Menthol, Topical Analgesic, (BIOFREEZE) 4 % GEL Apply topically. As needed for back pain    ? cyclobenzaprine (FLEXERIL) 5 MG tablet Take 1 tablet (5 mg total) by mouth 3 (three) times daily as needed for muscle spasms. 15 tablet 0  ? gabapentin (NEURONTIN) 100 MG capsule Take 1 capsule (100 mg total) by mouth 2 (two) times daily. 30 capsule 0  ? lidocaine (LIDODERM) 5 % Place 1 patch onto the skin every 12 (twelve) hours. Remove and discard patch within 12 hours or as directed by doctor. (Max of 1 patch in 24 hours). (Patient not taking: Reported on 08/25/2021) 10 patch 0  ? HYDROcodone-acetaminophen (NORCO/VICODIN) 5-325 MG tablet Take 1 tablet by mouth every 6  (six) hours as needed. (Patient not taking: Reported on 08/25/2021) 15 tablet 0  ? ?No facility-administered medications prior to visit.  ? ? ?No Known Allergies ? ?ROS ?Review of Systems  ?Constitutional: Negative.   ?Respiratory: Negative.    ?Cardiovascular: Negative.   ?Musculoskeletal:  Positive for back pain (chronic back pain).  ?Neurological: Negative.   ? ?  ?Objective:  ?  ?Physical Exam ?HENT:  ?   Head: Normocephalic and atraumatic.  ?Cardiovascular:  ?   Rate and Rhythm: Normal rate and regular rhythm.  ?   Pulses: Normal pulses.  ?   Heart sounds: Normal heart sounds.  ?Pulmonary:  ?   Effort: Pulmonary effort is normal.  ?   Breath sounds: Normal breath sounds.  ?Musculoskeletal:     ?   General: Tenderness (to lower back with palpation) present.  ?Skin: ?   General: Skin is warm.  ?Neurological:  ?   General: No focal deficit present.  ?   Mental Status: She is alert and oriented to person, place, and time. Mental status is at baseline.  ?Psychiatric:     ?   Mood and Affect: Mood normal.     ?   Behavior: Behavior normal.     ?   Thought Content: Thought content normal.     ?   Judgment: Judgment normal.  ? ? ?BP 92/60 (BP Location: Left Arm, Patient Position: Sitting, Cuff Size: Normal)   Pulse 85   Temp 98 ?F (36.7 ?C) (Oral)   Resp 18   Ht $R'5\' 6"'GR$  (1.676 m)   Wt 141 lb 12.8 oz (64.3 kg)   LMP 08/24/2021 (Exact Date)   SpO2 91%   BMI 22.89 kg/m?  ?Wt Readings from Last 3 Encounters:  ?08/25/21 141 lb 12.8 oz (64.3 kg)  ?08/10/21 142 lb 8 oz (64.6 kg)  ?07/27/21 125 lb (56.7 kg)  ? ? ? ?Health Maintenance Due  ?Topic Date Due  ? COVID-19 Vaccine (1) Never done  ? HIV Screening  Never done  ? Hepatitis C Screening  Never done  ? TETANUS/TDAP  Never done  ? PAP SMEAR-Modifier  Never done  ? COLONOSCOPY (Pts 45-30yrs Insurance coverage will need to be confirmed)  Never done  ? ? ?There are no preventive care reminders to display for this patient. ? ?No results found for: TSH ?Lab Results   ?Component Value Date  ? WBC 16.0 (H) 10/13/2018  ? HGB 13.0 10/13/2018  ? HCT 38.3 10/13/2018  ? MCV 87.8 10/13/2018  ? PLT 410 (H) 10/13/2018  ? ?  Lab Results  ?Component Value Date  ? NA 138 10/13/2018  ? K 3.5 10/13/2018  ? CO2 25 10/13/2018  ? GLUCOSE 118 (H) 10/13/2018  ? BUN 6 10/13/2018  ? CREATININE 0.73 10/13/2018  ? BILITOT 0.6 10/13/2018  ? ALKPHOS 124 10/13/2018  ? AST 117 (H) 10/13/2018  ? ALT 82 (H) 10/13/2018  ? PROT 7.1 10/13/2018  ? ALBUMIN 3.9 10/13/2018  ? CALCIUM 9.0 10/13/2018  ? ANIONGAP 8 10/13/2018  ? ?No results found for: CHOL ?No results found for: HDL ?No results found for: [place] ?No results found for: TRIG ?No results found for: CHOLHDL ?No results found for: HGBA1C ? ?  ?Assessment & Plan:  ? ? ? ? ?1. Chronic right-sided low back pain, unspecified whether sciatica present ?-She will continue on gabapentin, was advised to go to the ED for worsening symptoms. She was encouraged to follow up with her appointments at [facility]. ?- gabapentin (NEURONTIN) 100 MG capsule; Take 1 capsule (100 mg total) by mouth 2 (two) times daily.  Dispense: 14 capsule; Refill: 0 ? ?2. Encounter to establish care ?- Routine labs will be checked. ?- CBC w/Diff; Future ?- Comp Met (CMET); Future ?- Lipid panel; Future ?- HgB A1c; Future ?- HgB A1c ?- Lipid panel ?- Comp Met (CMET) ?- CBC w/Diff ? ? ?Follow-up: Return in about 11 weeks (around 11/10/2021), or if symptoms worsen or fail to improve.  ? ? ?Parthena Fergeson Jerold Coombe, NP ?

## 2021-08-26 LAB — CBC WITH DIFFERENTIAL/PLATELET
Basophils Absolute: 0.1 10*3/uL (ref 0.0–0.2)
Basos: 2 %
EOS (ABSOLUTE): 0.5 10*3/uL — ABNORMAL HIGH (ref 0.0–0.4)
Eos: 7 %
Hematocrit: 35.4 % (ref 34.0–46.6)
Hemoglobin: 11.7 g/dL (ref 11.1–15.9)
Immature Grans (Abs): 0 10*3/uL (ref 0.0–0.1)
Immature Granulocytes: 0 %
Lymphocytes Absolute: 2.5 10*3/uL (ref 0.7–3.1)
Lymphs: 32 %
MCH: 28.1 pg (ref 26.6–33.0)
MCHC: 33.1 g/dL (ref 31.5–35.7)
MCV: 85 fL (ref 79–97)
Monocytes Absolute: 0.4 10*3/uL (ref 0.1–0.9)
Monocytes: 6 %
Neutrophils Absolute: 4.1 10*3/uL (ref 1.4–7.0)
Neutrophils: 53 %
Platelets: 336 10*3/uL (ref 150–450)
RBC: 4.17 x10E6/uL (ref 3.77–5.28)
RDW: 16.8 % — ABNORMAL HIGH (ref 11.7–15.4)
WBC: 7.7 10*3/uL (ref 3.4–10.8)

## 2021-08-26 LAB — LIPID PANEL
Chol/HDL Ratio: 4.7 ratio — ABNORMAL HIGH (ref 0.0–4.4)
Cholesterol, Total: 238 mg/dL — ABNORMAL HIGH (ref 100–199)
HDL: 51 mg/dL (ref >39)
LDL Chol Calc (NIH): 145 mg/dL — ABNORMAL HIGH (ref 0–99)
Triglycerides: 232 mg/dL — ABNORMAL HIGH (ref 0–149)
VLDL Cholesterol Cal: 42 mg/dL — ABNORMAL HIGH (ref 5–40)

## 2021-08-26 LAB — HEMOGLOBIN A1C
Est. average glucose Bld gHb Est-mCnc: 105 mg/dL
Hgb A1c MFr Bld: 5.3 % (ref 4.8–5.6)

## 2021-08-26 LAB — COMPREHENSIVE METABOLIC PANEL
ALT: 20 IU/L (ref 0–32)
AST: 21 IU/L (ref 0–40)
Albumin/Globulin Ratio: 1.8 (ref 1.2–2.2)
Albumin: 4 g/dL (ref 3.8–4.8)
Alkaline Phosphatase: 77 IU/L (ref 44–121)
BUN/Creatinine Ratio: 11 (ref 9–23)
BUN: 9 mg/dL (ref 6–24)
Bilirubin Total: <0.2 mg/dL (ref 0.0–1.2)
CO2: 21 mmol/L (ref 20–29)
Calcium: 8.5 mg/dL — ABNORMAL LOW (ref 8.7–10.2)
Chloride: 103 mmol/L (ref 96–106)
Creatinine, Ser: 0.84 mg/dL (ref 0.57–1.00)
Globulin, Total: 2.2 g/dL (ref 1.5–4.5)
Glucose: 90 mg/dL (ref 70–99)
Potassium: 5 mmol/L (ref 3.5–5.2)
Sodium: 136 mmol/L (ref 134–144)
Total Protein: 6.2 g/dL (ref 6.0–8.5)
eGFR: 86 mL/min/{1.73_m2} (ref >59)

## 2021-08-31 ENCOUNTER — Other Ambulatory Visit: Payer: Self-pay | Admitting: Emergency Medicine

## 2021-08-31 DIAGNOSIS — M545 Low back pain, unspecified: Secondary | ICD-10-CM

## 2021-08-31 MED ORDER — CYCLOBENZAPRINE HCL 10 MG PO TABS: 10.0000 mg | ORAL_TABLET | Freq: Three times a day (TID) | ORAL | 0 refills | Status: DC | PRN

## 2021-08-31 NOTE — Progress Notes (Signed)
Patient called stating she was seen on 08/25/21 and was supposed to get a prescription for Gabapentin and muscle relaxer (Flexeril). She states that only the Gabapentin was sent in. Spoke with Lanora Manis, she ok'd rx for Flexeril 10mg  tid prn #[id number]. Sent prescription to road and advised patient of above. ?

## 2021-09-20 ENCOUNTER — Telehealth: Payer: Self-pay | Admitting: Gerontology

## 2021-09-20 NOTE — Telephone Encounter (Signed)
Patient called the [facility] cursing because Benjamine Mola, NP will not fill a narcotic medication for her back pain. Patient was advised that she will get a referral to the pain clinic for them to further advise on care. As of today patient states that she wishes to receive medical care from the [facility]. All future appointments will be cancelled. ?

## 2021-10-05 ENCOUNTER — Emergency Department
Admission: EM | Admit: 2021-10-05 | Discharge: 2021-10-05 | Disposition: A | Discharge status: Home / Self Care | Payer: Self-pay | Attending: Emergency Medicine | Admitting: Emergency Medicine

## 2021-10-05 ENCOUNTER — Other Ambulatory Visit: Payer: Self-pay

## 2021-10-05 DIAGNOSIS — J020 Streptococcal pharyngitis: Secondary | ICD-10-CM | POA: Insufficient documentation

## 2021-10-05 LAB — GROUP A STREP BY PCR: Group A Strep by PCR: NOT DETECTED

## 2021-10-05 MED ORDER — HYDROCODONE-ACETAMINOPHEN 5-325 MG PO TABS
1.0000 | ORAL_TABLET | Freq: Once | ORAL | Status: AC
  Administered 2021-10-05: 1 via ORAL
  Filled 2021-10-05: qty 1

## 2021-10-05 MED ORDER — LIDOCAINE VISCOUS HCL 2 % MT SOLN: 10.0000 mL | OROMUCOSAL | 0 refills | Status: AC | PRN

## 2021-10-05 MED ORDER — AMOXICILLIN 500 MG PO CAPS
1000.0000 mg | ORAL_CAPSULE | Freq: Once | ORAL | Status: AC
  Administered 2021-10-05: 1000 mg via ORAL
  Filled 2021-10-05: qty 2

## 2021-10-05 MED ORDER — LIDOCAINE VISCOUS HCL 2 % MT SOLN
15.0000 mL | Freq: Once | OROMUCOSAL | Status: AC
Start: 2021-10-05 — End: 2021-10-05
  Administered 2021-10-05: 15 mL via OROMUCOSAL
  Filled 2021-10-05: qty 15

## 2021-10-05 MED ORDER — AMOXICILLIN 875 MG PO TABS: 875.0000 mg | ORAL_TABLET | Freq: Two times a day (BID) | ORAL | 0 refills | Status: DC

## 2021-10-05 NOTE — ED Triage Notes (Signed)
Pt presents to ER c/o sore throat and mouth lesions that she noticed started yesterday.  Pt denies fever at home.  States she has been around somebody with strep throat recently.  Pt is A&O x4 at this time in NAD.  Multiple lesions/blisters noted to tongue.   ?

## 2021-10-05 NOTE — ED Provider Notes (Signed)
? ?[facility] ?Provider Note ? ?Patient Contact: 9:21 PM (approximate) ? ? ?History  ? ?Mouth Lesions and Sore Throat ? ? ?HPI ? ?Donna Howard is a 51 y.o. female who presents to the emergency department complaining of sore throat.  Patient states that one of her friends daughter was recently diagnosed with strep.  She has not been in contact with this individual but has been in touch with the emergency parents.  Patient has sore throat feeling like she is swallowing razor blades.  No difficulty breathing.  She states that solids really hurts to swallow but she still drinking fluids.  No reported fever, no cough, no other symptoms at this time. ?  ? ? ?Physical Exam  ? ?Triage Vital Signs: ?ED Triage Vitals  ?Enc Vitals Group  ?   BP 10/05/21 2014 102/80  ?   Pulse Rate 10/05/21 2014 81  ?   Resp 10/05/21 2014 16  ?   Temp 10/05/21 2014 98.1 ?F (36.7 ?C)  ?   Temp Source 10/05/21 2014 Oral  ?   SpO2 10/05/21 2014 98 %  ?   Weight 10/05/21 2015 125 lb (56.7 kg)  ?   Height 10/05/21 2015 5\' 7"  (1.702 m)  ?   Head Circumference --   ?   Peak Flow --   ?   Pain Score 10/05/21 2014 8  ?   Pain Loc --   ?   Pain Edu? --   ?   Excl. in GC? --   ? ? ?Most recent vital signs: ?Vitals:  ? 10/05/21 2014  ?BP: 102/80  ?Pulse: 81  ?Resp: 16  ?Temp: 98.1 ?F (36.7 ?C)  ?SpO2: 98%  ? ? ? ?General: Alert and in no acute distress. ?ENT: ?     Ears:  ?     Nose: No congestion/rhinnorhea. ?     Mouth/Throat: Mucous membranes are moist.  Erythema in the oropharynx with few scattered exudates.  Patient has enlarged lingular tonsils, uvula is midline. ?Neck: No stridor. No cervical spine tenderness to palpation. ?Hematological/Lymphatic/Immunilogical: Tender anterior cervical lymphadenopathy. ?Cardiovascular:  Good peripheral perfusion ?Respiratory: Normal respiratory effort without tachypnea or retractions. Lungs CTAB. Good air entry to the bases with no decreased or absent breath sounds. ?Musculoskeletal: Full  range of motion to all extremities.  ?Neurologic:  No gross focal neurologic deficits are appreciated.  ?Skin:   No rash noted ?Other: ? ? ?ED Results / Procedures / Treatments  ? ?Labs ?(all labs ordered are listed, but only abnormal results are displayed) ?Labs Reviewed  ?GROUP A STREP BY PCR  ? ? ? ?EKG ? ? ? ? ?RADIOLOGY ? ? ? ? ?No results found. ? ?PROCEDURES: ? ?Critical Care performed: No ? ?Procedures ? ? ?MEDICATIONS ORDERED IN ED: ?Medications  ?lidocaine (XYLOCAINE) 2 % viscous mouth solution 15 mL (has no administration in time range)  ?amoxicillin (AMOXIL) capsule 1,000 mg (has no administration in time range)  ?HYDROcodone-acetaminophen (NORCO/VICODIN) 5-325 MG per tablet 1 tablet (has no administration in time range)  ? ? ? ?IMPRESSION / MDM / ASSESSMENT AND PLAN / ED COURSE  ?I reviewed the triage vital signs and the nursing notes. ?             ?               ? ?Differential diagnosis includes, but is not limited to, strep pharyngitis, viral pharyngitis, peritonsillar abscess, retropharyngeal abscess ? ? ?Patient's diagnosis is consistent  with strep pharyngitis.  Patient presents emergency department sore throat.  Patient is concerned she may have strep as she was in contact with an individual with close strep contact.  She does have some exudates, tender lymph nodes.  Strep test was negative but I am still concerned for strep other than group A.  At this time I will treat with amoxicillin and symptom control medications of viscous lidocaine.  Low suspicion for mono, retropharyngeal abscess, peritonsillar abscess.  Return precautions discussed with the patient.. Patient is given ED precautions to return to the ED for any worsening or new symptoms. ? ? ? ?  ? ? ?FINAL CLINICAL IMPRESSION(S) / ED DIAGNOSES  ? ?Final diagnoses:  ?Strep pharyngitis  ? ? ? ?Rx / DC Orders  ? ?ED Discharge Orders   ? ?      Ordered  ?  amoxicillin (AMOXIL) 875 MG tablet  2 times daily       ? 10/05/21 2209  ?  lidocaine  (XYLOCAINE) 2 % solution  Every 4 hours PRN       ? 10/05/21 2209  ? ?  ?  ? ?  ? ? ? ?Note:  This document was prepared using Dragon voice recognition software and may include unintentional dictation errors. ?  ?Racheal Patches, PA-C ?10/05/21 2209 ? ?  ?Phineas Semen, MD ?10/05/21 2212 ? ?

## 2021-10-05 NOTE — ED Notes (Signed)
PT provided with dc ppw. Pt  questions answered.  Pt follow up and rx information reviewed. Pt  declines vs at dc. Pt  provides verbal consent for dc and is ambulatory to lobby on foot alert and oriented x4. ?

## 2021-10-20 ENCOUNTER — Other Ambulatory Visit: Payer: Self-pay

## 2021-10-20 ENCOUNTER — Encounter: Payer: Self-pay | Admitting: Emergency Medicine

## 2021-10-20 ENCOUNTER — Ambulatory Visit
Admission: EM | Admit: 2021-10-20 | Discharge: 2021-10-20 | Disposition: A | Discharge status: Home / Self Care | Payer: 59 | Attending: Physician Assistant | Admitting: Physician Assistant

## 2021-10-20 ENCOUNTER — Ambulatory Visit: Payer: Self-pay

## 2021-10-20 DIAGNOSIS — Z760 Encounter for issue of repeat prescription: Secondary | ICD-10-CM

## 2021-10-20 DIAGNOSIS — R519 Headache, unspecified: Secondary | ICD-10-CM

## 2021-10-20 DIAGNOSIS — J019 Acute sinusitis, unspecified: Secondary | ICD-10-CM | POA: Diagnosis not present

## 2021-10-20 DIAGNOSIS — J45909 Unspecified asthma, uncomplicated: Secondary | ICD-10-CM | POA: Diagnosis not present

## 2021-10-20 DIAGNOSIS — R051 Acute cough: Secondary | ICD-10-CM

## 2021-10-20 MED ORDER — AMOXICILLIN-POT CLAVULANATE 875-125 MG PO TABS: 1.0000 | ORAL_TABLET | Freq: Two times a day (BID) | ORAL | 0 refills | Status: AC

## 2021-10-20 MED ORDER — PROMETHAZINE-DM 6.25-15 MG/5ML PO SYRP: 5.0000 mL | ORAL_SOLUTION | Freq: Four times a day (QID) | ORAL | 0 refills | Status: DC | PRN

## 2021-10-20 MED ORDER — ALBUTEROL SULFATE HFA 108 (90 BASE) MCG/ACT IN AERS: 1.0000 | INHALATION_SPRAY | Freq: Four times a day (QID) | RESPIRATORY_TRACT | 1 refills | Status: AC | PRN

## 2021-10-20 MED ORDER — IPRATROPIUM BROMIDE 0.06 % NA SOLN
2.0000 | Freq: Four times a day (QID) | NASAL | 0 refills | Status: DC
Start: 2021-10-20 — End: 2023-01-08

## 2021-10-20 MED ORDER — KETOROLAC TROMETHAMINE 60 MG/2ML IM SOLN
30.0000 mg | Freq: Once | INTRAMUSCULAR | Status: AC
  Administered 2021-10-20: 30 mg via INTRAMUSCULAR

## 2021-10-20 NOTE — ED Provider Notes (Signed)
[facility]    CSN: [id number] Arrival date & time: 10/20/21  1647      History   Chief Complaint Chief Complaint  Patient presents with   Cough   Appointment    5:00    HPI Lindley Magnusina M [place] is a 51 y.o. female presenting for 1 week history of headaches, sinus pressure and pain, nasal congestion with yellowish-green drainage, pain in her teeth, bilateral ear pain and pressure, postnasal drainage, productive cough and fatigue.  Patient reports symptoms are getting worse especially her headache.  Reports trying OTC meds without improvement in symptoms.  She has not had any fevers or breathing difficulty.  Has a history of asthma and is a smoker.  Patient is out of her albuterol inhaler and cannot see your primary care provider for 2 more months.  She is asking for refill for that.  Patient also requesting a Toradol shot for her headache.  States she has been given that in the past and that has been helpful.  Reports taking 2 Advil earlier this morning.  Medical history significant for chronic back pain and sciatica.  Patient has upcoming appointment with his spine specialist in 2 weeks.  No other complaints today.  HPI  Past Medical History:  Diagnosis Date   Asthma     Patient Active Problem List   Diagnosis Date Noted   Encounter to establish care 08/10/2021   History of asthma 08/10/2021   Chronic lower back pain 08/10/2021   Anxiety 08/10/2021   Smoking 08/10/2021    Past Surgical History:  Procedure Laterality Date   TUBAL LIGATION      OB History   No obstetric history on file.      Home Medications    Prior to Admission medications   Medication Sig Start Date End Date Taking? Authorizing Provider  albuterol (VENTOLIN HFA) 108 (90 Base) MCG/ACT inhaler Inhale 1-2 puffs into the lungs every 6 (six) hours as needed for wheezing or shortness of breath. 10/20/21  Yes Eusebio FriendlyEaves, Kimm Ungaro B, PA-C  amoxicillin-clavulanate (AUGMENTIN) 875-125 MG tablet Take 1 tablet  by mouth every 12 (twelve) hours for 7 days. 10/20/21 10/27/21 Yes Eusebio FriendlyEaves, Oskar Cretella B, PA-C  ipratropium (ATROVENT) 0.06 % nasal spray Place 2 sprays into both nostrils 4 (four) times daily. 10/20/21  Yes Shirlee LatchEaves, Janely Gullickson B, PA-C  promethazine-dextromethorphan (PROMETHAZINE-DM) 6.25-15 MG/5ML syrup Take 5 mLs by mouth 4 (four) times daily as needed. 10/20/21  Yes Shirlee LatchEaves, Willona Phariss B, PA-C  albuterol (PROVENTIL HFA) 108 (90 Base) MCG/ACT inhaler Inhale 2 puffs into the lungs once every 6 (six) hours as needed for wheezing or shortness of breath. Patient not taking: Reported on 10/20/2021 08/10/21   Iloabachie, Chioma E, NP  amoxicillin (AMOXIL) 875 MG tablet Take 1 tablet (875 mg total) by mouth 2 (two) times daily. Patient not taking: Reported on 10/20/2021 10/05/21   Cuthriell, Delorise RoyalsJonathan D, PA-C  Aspirin-Acetaminophen (GOODYS BACK & BODY PAIN) 500-325 MG PACK Take by mouth. Taking 1 to 10 daily prn back pain    [provider]  cetirizine (ZYRTEC) 10 MG tablet Take 1 tablet (10 mg total) by mouth daily. 08/02/15   Cuthriell, Delorise RoyalsJonathan D, PA-C  cyclobenzaprine (FLEXERIL) 10 MG tablet Take 1 tablet (10 mg total) by mouth 3 (three) times daily as needed. 08/31/21   Iloabachie, Chioma E, NP  gabapentin (NEURONTIN) 100 MG capsule Take 1 capsule (100 mg total) by mouth 2 (two) times daily. 08/25/21   Iloabachie, Chioma E, NP  lidocaine (LIDODERM)  5 % Place 1 patch onto the skin every 12 (twelve) hours. Remove and discard patch within 12 hours or as directed by doctor. (Max of 1 patch in 24 hours). Patient not taking: Reported on 08/25/2021 08/10/21   Iloabachie, Chioma E, NP  lidocaine (XYLOCAINE) 2 % solution Use as directed 10 mLs in the mouth or throat every 4 (four) hours as needed for mouth pain. Gargle and spit 10/05/21   Cuthriell, Delorise Royals, PA-C  Menthol, Topical Analgesic, (BIOFREEZE) 4 % GEL Apply topically. As needed for back pain    [provider]    Family History Family History  Problem Relation Age  of Onset   Hyperlipidemia Mother    Hypertension Mother    Diabetes Mother        pre-diabetes   Other Father        accidental electrocution   Hypertension Brother    Diabetes Brother    Heart attack Maternal Grandmother    Other Maternal Grandfather        unknown medical history   Diabetes Paternal Grandmother    Heart attack Paternal Grandfather     Social History Social History   Tobacco Use   Smoking status: Every Day    Packs/day: 1.00    Years: 24.00    Pack years: 24.00    Types: Cigarettes   Smokeless tobacco: Never  Vaping Use   Vaping Use: Never used  Substance Use Topics   Alcohol use: Yes    Comment: occassionally   Drug use: No     Allergies   Patient has no known allergies.   Review of Systems Review of Systems  Constitutional:  Positive for fatigue. Negative for chills, diaphoresis and fever.  HENT:  Positive for congestion, ear pain, postnasal drip, rhinorrhea, sinus pressure and sinus pain. Negative for sore throat.   Respiratory:  Positive for cough. Negative for shortness of breath and wheezing.   Cardiovascular:  Negative for chest pain.  Gastrointestinal:  Negative for abdominal pain, nausea and vomiting.  Musculoskeletal:  Positive for back pain (chronic). Negative for arthralgias and myalgias.  Skin:  Negative for rash.  Neurological:  Negative for weakness and headaches.  Hematological:  Negative for adenopathy.    Physical Exam Triage Vital Signs ED Triage Vitals  Enc Vitals Group     BP      Pulse      Resp      Temp      Temp src      SpO2      Weight      Height      Head Circumference      Peak Flow      Pain Score      Pain Loc      Pain Edu?      Excl. in GC?    No data found.  Updated Vital Signs BP 137/79 (BP Location: Left Arm)   Pulse 93   Temp 98.6 F (37 C) (Oral)   Resp 20   LMP 09/28/2021 (Approximate)   SpO2 100%      Physical Exam Vitals and nursing note reviewed.  Constitutional:       General: She is not in acute distress.    Appearance: Normal appearance. She is ill-appearing. She is not toxic-appearing.  HENT:     Head: Normocephalic and atraumatic.     Right Ear: Tympanic membrane, ear canal and external ear normal.     Left Ear: Tympanic  membrane, ear canal and external ear normal.     Nose: Congestion present.     Right Sinus: Maxillary sinus tenderness present.     Left Sinus: Maxillary sinus tenderness present.     Mouth/Throat:     Mouth: Mucous membranes are moist.     Pharynx: Oropharynx is clear. Posterior oropharyngeal erythema present.  Eyes:     General: No scleral icterus.       Right eye: No discharge.        Left eye: No discharge.     Conjunctiva/sclera: Conjunctivae normal.  Cardiovascular:     Rate and Rhythm: Normal rate and regular rhythm.     Heart sounds: Normal heart sounds.  Pulmonary:     Effort: Pulmonary effort is normal. No respiratory distress.     Breath sounds: Normal breath sounds.  Musculoskeletal:     Cervical back: Neck supple.  Skin:    General: Skin is dry.  Neurological:     General: No focal deficit present.     Mental Status: She is alert. Mental status is at baseline.     Motor: No weakness.     Gait: Gait normal.  Psychiatric:        Mood and Affect: Mood normal.        Behavior: Behavior normal.        Thought Content: Thought content normal.     UC Treatments / Results  Labs (all labs ordered are listed, but only abnormal results are displayed) Labs Reviewed - No data to display  EKG   Radiology No results found.  Procedures Procedures (including critical care time)  Medications Ordered in UC Medications  ketorolac (TORADOL) injection 30 mg (30 mg Intramuscular Given 10/20/21 1814)    Initial Impression / Assessment and Plan / UC Course  I have reviewed the triage vital signs and the nursing notes.  Pertinent labs & imaging results that were available during my care of the patient were  reviewed by me and considered in my medical decision making (see chart for details).  51 year old female presenting for fatigue, productive cough, sinus pressure and pain with pain radiating to teeth and behind eyes, headache, postnasal drainage.  Also requesting refill of albuterol inhaler.  History of asthma.  Has tried over-the-counter meds without improvement in symptoms and patient reports symptoms have worsened.  Vitals are normal and stable.  She is ill-appearing but nontoxic.  Exam significant for nasal congestion with slight yellowish drainage and tenderness palpation of bilateral maxillary sinus areas, postnasal drainage.  Chest clear to auscultation.  Patient given 30 mg IM ketorolac in clinic for acute pain relief of headache.  Advised patient I believe her sinusitis is most likely viral but given how badly she feels, we will try an antibiotic.  Sent Augmentin to pharmacy as well as Promethazine DM and Atrovent nasal spray.  Refilled her albuterol inhaler.  Advise increasing rest and fluids.  Reviewed return and ER precautions.  Final Clinical Impressions(s) / UC Diagnoses   Final diagnoses:  Acute sinusitis, recurrence not specified, unspecified location  Acute cough  Uncomplicated asthma, unspecified asthma severity, unspecified whether persistent  Medication refill  Acute nonintractable headache, unspecified headache type     Discharge Instructions      -As we discussed, I believe your symptoms are most likely due to a virus.  However, I request I have sent antibiotics to pharmacy for you to try.  I have also sent cough medicine and nasal spray.  Increase  rest and fluid intake.  Can take over-the-counter Sudafed as well.  -Hopefully the ketorolac gives you some relief.  Do not take any more anti-inflammatory medicine tonight.  You can take Tylenol. -I refilled your albuterol inhaler. - Go to ER for fever, worsening cough, increased breathing difficulty, severe or worsening  headaches.     ED Prescriptions     Medication Sig Dispense Auth. Provider   promethazine-dextromethorphan (PROMETHAZINE-DM) 6.25-15 MG/5ML syrup Take 5 mLs by mouth 4 (four) times daily as needed. 118 mL Eusebio Friendly B, PA-C   albuterol (VENTOLIN HFA) 108 (90 Base) MCG/ACT inhaler Inhale 1-2 puffs into the lungs every 6 (six) hours as needed for wheezing or shortness of breath. 1 g Eusebio Friendly B, PA-C   amoxicillin-clavulanate (AUGMENTIN) 875-125 MG tablet Take 1 tablet by mouth every 12 (twelve) hours for 7 days. 14 tablet Eusebio Friendly B, PA-C   ipratropium (ATROVENT) 0.06 % nasal spray Place 2 sprays into both nostrils 4 (four) times daily. 15 mL Shirlee Latch, PA-C      PDMP not reviewed this encounter.   Shirlee Latch, PA-C 10/20/21 1819

## 2021-10-20 NOTE — ED Triage Notes (Addendum)
Cough, congestion and headache, teeth hurt, ears hurt since Friday.  Facial swelling, blowing green secretions from nose

## 2021-10-20 NOTE — Discharge Instructions (Addendum)
-  As we discussed, I believe your symptoms are most likely due to a virus.  However, I request I have sent antibiotics to pharmacy for you to try.  I have also sent cough medicine and nasal spray.  Increase rest and fluid intake.  Can take over-the-counter Sudafed as well.  -Hopefully the ketorolac gives you some relief.  Do not take any more anti-inflammatory medicine tonight.  You can take Tylenol. -I refilled your albuterol inhaler. - Go to ER for fever, worsening cough, increased breathing difficulty, severe or worsening headaches.

## 2021-11-07 ENCOUNTER — Encounter: Payer: Self-pay | Admitting: Intensive Care

## 2021-11-07 ENCOUNTER — Emergency Department
Admission: EM | Admit: 2021-11-07 | Discharge: 2021-11-07 | Disposition: A | Discharge status: Home / Self Care | Payer: 59 | Attending: Emergency Medicine | Admitting: Emergency Medicine

## 2021-11-07 ENCOUNTER — Other Ambulatory Visit: Payer: Self-pay

## 2021-11-07 DIAGNOSIS — M549 Dorsalgia, unspecified: Secondary | ICD-10-CM | POA: Diagnosis present

## 2021-11-07 DIAGNOSIS — J45909 Unspecified asthma, uncomplicated: Secondary | ICD-10-CM | POA: Diagnosis not present

## 2021-11-07 DIAGNOSIS — M5441 Lumbago with sciatica, right side: Secondary | ICD-10-CM | POA: Diagnosis not present

## 2021-11-07 DIAGNOSIS — M5430 Sciatica, unspecified side: Secondary | ICD-10-CM

## 2021-11-07 MED ORDER — MORPHINE SULFATE (PF) 4 MG/ML IV SOLN
4.0000 mg | Freq: Once | INTRAVENOUS | Status: AC
  Administered 2021-11-07: 4 mg via INTRAMUSCULAR
  Filled 2021-11-07: qty 1

## 2021-11-07 MED ORDER — HYDROCODONE-ACETAMINOPHEN 5-325 MG PO TABS: 1.0000 | ORAL_TABLET | Freq: Four times a day (QID) | ORAL | 0 refills | Status: DC | PRN

## 2021-11-07 NOTE — ED Triage Notes (Signed)
Patient reports sciatica flare up on right side X1 week

## 2021-11-07 NOTE — ED Notes (Signed)
Dc ppw provided to pt. rx info, follow up and questiosns addressed. pt declines vs at time of dc and provides verbal consent for dc. pt ambulatory off unit. 

## 2021-11-07 NOTE — ED Provider Notes (Signed)
   [facility] Provider Note    Event Date/Time   First MD Initiated Contact with Patient 11/07/21 2017     (approximate)  History   Chief Complaint: Sciatica  HPI  Donna Howard is a 51 y.o. female with a past medical history of asthma, back pain, presents to the emergency department for sciatica pain.  According to the patient she has a long history of sciatica affecting the right leg.  States it flares up from time to time.  Patient states over the past 1 week it has been flared up and she is now experiencing pain shooting down the right leg.  Patient denies any numbness.  Patient denies any weakness to the leg.  No bowel or bladder incontinence.  Physical Exam   Triage Vital Signs: ED Triage Vitals  Enc Vitals Group     BP 11/07/21 1853 (!) 150/100     Pulse Rate 11/07/21 1853 85     Resp 11/07/21 1853 18     Temp 11/07/21 1853 98.2 F (36.8 C)     Temp Source 11/07/21 1853 Oral     SpO2 11/07/21 1853 100 %     Weight 11/07/21 1849 135 lb (61.2 kg)     Height 11/07/21 1849 5\' 7"  (1.702 m)     Head Circumference --      Peak Flow --      Pain Score 11/07/21 1849 9     Pain Loc --      Pain Edu? --      Excl. in GC? --     Most recent vital signs: Vitals:   11/07/21 1853  BP: (!) 150/100  Pulse: 85  Resp: 18  Temp: 98.2 F (36.8 C)  SpO2: 100%    General: Awake, no distress.  CV:  Good peripheral perfusion.  Regular rate and rhythm  Resp:  Normal effort.  Equal breath sounds bilaterally.  Abd:  No distention.  Soft, nontender.  No rebound or guarding. Other:  Good strength in bilateral lower extremities.   ED Results / Procedures / Treatments   MEDICATIONS ORDERED IN ED: Medications  morphine (PF) 4 MG/ML injection 4 mg (has no administration in time range)     IMPRESSION / MDM / ASSESSMENT AND PLAN / ED COURSE  I reviewed the triage vital signs and the nursing notes.  Patient's presentation is most consistent with acute  presentation with potential threat to life or bodily function.  Patient presents emergency department for 1 week of lower back pain radiating down the right leg consistent with sciatica.  Patient states a history of the same previously.  Overall the patient appears well, no distress.  No red flags including no incontinence weakness or numbness.  Patient states allergy to prednisone.  We will dose a one-time shot of pain medication in the emergency department discharged with 5 Norco tablets have the patient follow-up with her doctor.  Patient states she has insurance and she will follow-up with [facility].  FINAL CLINICAL IMPRESSION(S) / ED DIAGNOSES   Back pain Sciatica   Note:  This document was prepared using Dragon voice recognition software and may include unintentional dictation errors.   [place], MD 11/07/21 2028

## 2021-11-17 ENCOUNTER — Ambulatory Visit: Payer: Self-pay | Admitting: Gerontology

## 2021-12-22 ENCOUNTER — Ambulatory Visit: Payer: Self-pay

## 2021-12-22 ENCOUNTER — Ambulatory Visit
Admission: EM | Admit: 2021-12-22 | Discharge: 2021-12-22 | Disposition: A | Discharge status: Home / Self Care | Payer: 59

## 2021-12-22 ENCOUNTER — Encounter: Payer: Self-pay | Admitting: Emergency Medicine

## 2021-12-22 DIAGNOSIS — S025XXA Fracture of tooth (traumatic), initial encounter for closed fracture: Secondary | ICD-10-CM

## 2021-12-22 MED ORDER — OXYCODONE-ACETAMINOPHEN 5-325 MG PO TABS: 1.0000 | ORAL_TABLET | Freq: Four times a day (QID) | ORAL | 0 refills | Status: DC | PRN

## 2021-12-22 MED ORDER — AMOXICILLIN-POT CLAVULANATE 875-125 MG PO TABS: 1.0000 | ORAL_TABLET | Freq: Two times a day (BID) | ORAL | 0 refills | Status: AC

## 2021-12-22 MED ORDER — KETOROLAC TROMETHAMINE 60 MG/2ML IM SOLN
60.0000 mg | Freq: Once | INTRAMUSCULAR | Status: AC
  Administered 2021-12-22: 60 mg via INTRAMUSCULAR

## 2021-12-22 NOTE — ED Provider Notes (Signed)
[facility]    CSN: [id number] Arrival date & time: 12/22/21  1743      History   Chief Complaint Chief Complaint  Patient presents with   Dental Pain    HPI Donna Howard is a 51 y.o. female.   HPI  51 year old female here for evaluation of dental complaint.  Patient reports that she has been experiencing pain in her left lower rear molar for the last 3 days.  The tooth is broken in that area.  She states she has had a subjective fever but is unaware of any drainage.  She does not have a dentist at present or dental insurance.  Past Medical History:  Diagnosis Date   Asthma     Patient Active Problem List   Diagnosis Date Noted   Encounter to establish care 08/10/2021   History of asthma 08/10/2021   Chronic lower back pain 08/10/2021   Anxiety 08/10/2021   Smoking 08/10/2021    Past Surgical History:  Procedure Laterality Date   TUBAL LIGATION      OB History   No obstetric history on file.      Home Medications    Prior to Admission medications   Medication Sig Start Date End Date Taking? Authorizing Provider  amoxicillin-clavulanate (AUGMENTIN) 875-125 MG tablet Take 1 tablet by mouth every 12 (twelve) hours for 10 days. 12/22/21 01/01/22 Yes Becky Augusta, NP  gabapentin (NEURONTIN) 100 MG capsule Take 1 capsule (100 mg total) by mouth 2 (two) times daily. 08/25/21  Yes Iloabachie, Chioma E, NP  oxyCODONE-acetaminophen (PERCOCET/ROXICET) 5-325 MG tablet Take 1 tablet by mouth every 6 (six) hours as needed for severe pain. 12/22/21  Yes Becky Augusta, NP  albuterol (PROVENTIL HFA) 108 (90 Base) MCG/ACT inhaler Inhale 2 puffs into the lungs once every 6 (six) hours as needed for wheezing or shortness of breath. Patient not taking: Reported on 10/20/2021 08/10/21   Iloabachie, Chioma E, NP  albuterol (VENTOLIN HFA) 108 (90 Base) MCG/ACT inhaler Inhale 1-2 puffs into the lungs every 6 (six) hours as needed for wheezing or shortness of breath. 10/20/21   Shirlee Latch, PA-C  cetirizine (ZYRTEC) 10 MG tablet Take 1 tablet (10 mg total) by mouth daily. 08/02/15   Cuthriell, Delorise Royals, PA-C  clonazePAM (KLONOPIN) 0.5 MG tablet Take 0.5 mg by mouth 2 (two) times daily as needed. 11/21/21   [provider]  cyclobenzaprine (FLEXERIL) 10 MG tablet Take 1 tablet (10 mg total) by mouth 3 (three) times daily as needed. 08/31/21   Iloabachie, Chioma E, NP  hydrOXYzine (ATARAX) 25 MG tablet Take 25 mg by mouth 3 (three) times daily as needed. 11/21/21   [provider]  ipratropium (ATROVENT) 0.06 % nasal spray Place 2 sprays into both nostrils 4 (four) times daily. 10/20/21   Eusebio Friendly B, PA-C  lidocaine (LIDODERM) 5 % Place 1 patch onto the skin every 12 (twelve) hours. Remove and discard patch within 12 hours or as directed by doctor. (Max of 1 patch in 24 hours). Patient not taking: Reported on 08/25/2021 08/10/21   Iloabachie, Chioma E, NP  lidocaine (XYLOCAINE) 2 % solution Use as directed 10 mLs in the mouth or throat every 4 (four) hours as needed for mouth pain. Gargle and spit 10/05/21   Cuthriell, Delorise Royals, PA-C  Menthol, Topical Analgesic, (BIOFREEZE) 4 % GEL Apply topically. As needed for back pain    [provider]  promethazine-dextromethorphan (PROMETHAZINE-DM) 6.25-15 MG/5ML syrup Take 5 mLs  by mouth 4 (four) times daily as needed. 10/20/21   Shirlee Latch, PA-C  sertraline (ZOLOFT) 50 MG tablet Take 50 mg by mouth daily. 12/18/21   [provider]    Family History Family History  Problem Relation Age of Onset   Hyperlipidemia Mother    Hypertension Mother    Diabetes Mother        pre-diabetes   Other Father        accidental electrocution   Hypertension Brother    Diabetes Brother    Heart attack Maternal Grandmother    Other Maternal Grandfather        unknown medical history   Diabetes Paternal Grandmother    Heart attack Paternal Grandfather     Social History Social History   Tobacco Use    Smoking status: Every Day    Packs/day: 1.00    Years: 24.00    Total pack years: 24.00    Types: Cigarettes   Smokeless tobacco: Never  Vaping Use   Vaping Use: Never used  Substance Use Topics   Alcohol use: Yes    Comment: occassionally   Drug use: No     Allergies   Patient has no known allergies.   Review of Systems Review of Systems  Constitutional:  Positive for fever.  HENT:  Positive for dental problem. Negative for facial swelling and trouble swallowing.      Physical Exam Triage Vital Signs ED Triage Vitals  Enc Vitals Group     BP 12/22/21 1802 110/83     Pulse Rate 12/22/21 1802 90     Resp 12/22/21 1802 16     Temp 12/22/21 1802 98.1 F (36.7 C)     Temp Source 12/22/21 1802 Oral     SpO2 12/22/21 1802 100 %     Weight --      Height --      Head Circumference --      Peak Flow --      Pain Score 12/22/21 1759 10     Pain Loc --      Pain Edu? --      Excl. in GC? --    No data found.  Updated Vital Signs BP 110/83 (BP Location: Left Arm)   Pulse 90   Temp 98.1 F (36.7 C) (Oral)   Resp 16   LMP 11/21/2021   SpO2 100%   Visual Acuity Right Eye Distance:   Left Eye Distance:   Bilateral Distance:    Right Eye Near:   Left Eye Near:    Bilateral Near:     Physical Exam Vitals and nursing note reviewed.  Constitutional:      Appearance: Normal appearance. She is not ill-appearing.  HENT:     Head: Normocephalic and atraumatic.     Mouth/Throat:     Mouth: Mucous membranes are moist.     Pharynx: Oropharynx is clear. Posterior oropharyngeal erythema present. No oropharyngeal exudate.  Skin:    General: Skin is warm and dry.     Capillary Refill: Capillary refill takes less than 2 seconds.  Neurological:     General: No focal deficit present.     Mental Status: She is alert and oriented to person, place, and time.  Psychiatric:        Mood and Affect: Mood normal.        Behavior: Behavior normal.        Thought Content:  Thought content normal.  Judgment: Judgment normal.      UC Treatments / Results  Labs (all labs ordered are listed, but only abnormal results are displayed) Labs Reviewed - No data to display  EKG   Radiology No results found.  Procedures Procedures (including critical care time)  Medications Ordered in UC Medications  ketorolac (TORADOL) injection 60 mg (has no administration in time range)    Initial Impression / Assessment and Plan / UC Course  I have reviewed the triage vital signs and the nursing notes.  Pertinent labs & imaging results that were available during my care of the patient were reviewed by me and considered in my medical decision making (see chart for details).  Patient is a nontoxic-appearing 51 year old female here for evaluation of broken left lower rear molar.  On exam the patient has an old filling in place and the tooth does not appear to be a recent fracture.  She reports that the pain has been present for the last 3 days.  The surrounding gum tissue does not show any erythema, edema, or discharge.  There is no fullness in the floor of the mouth on either the lingual or buccal side.  No submental or submandibular fullness.  No cervical lymphadenopathy.  I will place the patient on antibiotics to cover for potential apical infection and will give her 6 tablets of Percocet for pain.  She has recently been given a prescription for Vicodin and she has open prescription for clonazepam.  Her MME score is 550 putting her at increased risk of overdose.  I have also given her a list of dental resources as well as information about following up at [facility] to be evaluated by the dentist on-call should her symptoms worsen.  She is requesting an injection for pain prior to discharge and I will order Toradol 60 mg IM.   Final Clinical Impressions(s) / UC Diagnoses   Final diagnoses:  Closed fracture of tooth, initial encounter     Discharge Instructions       Take the Augmentin twice daily with food for 10 days for treatment of your dental infection.  Use over-the-counter Tylenol and ibuprofen for swelling and mild to moderate pain.  Use the Percocet as needed for severe pain.  Rinse with warm salt water, or Listerine, after each meal to remove food particles and wash away any pus that is collecting.  If you develop any increasing or swelling, fever, pain, or difficulty swallowing you to go to the emergency department at [facility] with a have an oral surgeon and also a dentist on-call.      ED Prescriptions     Medication Sig Dispense Auth. Provider   amoxicillin-clavulanate (AUGMENTIN) 875-125 MG tablet Take 1 tablet by mouth every 12 (twelve) hours for 10 days. 20 tablet Becky Augusta, NP   oxyCODONE-acetaminophen (PERCOCET/ROXICET) 5-325 MG tablet Take 1 tablet by mouth every 6 (six) hours as needed for severe pain. 6 tablet Becky Augusta, NP      I have reviewed the PDMP during this encounter.   Becky Augusta, NP 12/22/21 [phone]

## 2021-12-22 NOTE — Discharge Instructions (Addendum)
Take the Augmentin twice daily with food for 10 days for treatment of your dental infection.  Use over-the-counter Tylenol and ibuprofen for swelling and mild to moderate pain.  Use the Percocet as needed for severe pain.  Rinse with warm salt water, or Listerine, after each meal to remove food particles and wash away any pus that is collecting.  If you develop any increasing or swelling, fever, pain, or difficulty swallowing you to go to the emergency department at [facility] with a have an oral surgeon and also a dentist on-call.

## 2021-12-22 NOTE — ED Triage Notes (Addendum)
Pt has a broken tooth on her bottom left side x 3 days. Pt does not have a future dental appointment.

## 2022-01-08 ENCOUNTER — Ambulatory Visit
Admission: EM | Admit: 2022-01-08 | Discharge: 2022-01-08 | Disposition: A | Discharge status: Home / Self Care | Payer: 59

## 2022-01-08 ENCOUNTER — Encounter: Payer: Self-pay | Admitting: Emergency Medicine

## 2022-01-08 DIAGNOSIS — K146 Glossodynia: Secondary | ICD-10-CM | POA: Diagnosis not present

## 2022-01-08 DIAGNOSIS — K0889 Other specified disorders of teeth and supporting structures: Secondary | ICD-10-CM | POA: Diagnosis not present

## 2022-01-08 MED ORDER — KETOROLAC TROMETHAMINE 10 MG PO TABS: 10.0000 mg | ORAL_TABLET | Freq: Four times a day (QID) | ORAL | 0 refills | Status: DC | PRN

## 2022-01-08 MED ORDER — CEFTRIAXONE SODIUM 1 G IJ SOLR
1.0000 g | Freq: Once | INTRAMUSCULAR | Status: AC
  Administered 2022-01-08: 1 g via INTRAMUSCULAR

## 2022-01-08 MED ORDER — KETOROLAC TROMETHAMINE 60 MG/2ML IM SOLN
60.0000 mg | Freq: Once | INTRAMUSCULAR | Status: AC
  Administered 2022-01-08: 60 mg via INTRAMUSCULAR

## 2022-01-08 MED ORDER — KETOROLAC TROMETHAMINE 60 MG/2ML IM SOLN: 30.0000 mg | Freq: Once | INTRAMUSCULAR | Status: DC

## 2022-01-08 MED ORDER — CLINDAMYCIN HCL 300 MG PO CAPS: 300.0000 mg | ORAL_CAPSULE | Freq: Three times a day (TID) | ORAL | 0 refills | Status: DC

## 2022-01-08 NOTE — Discharge Instructions (Addendum)
Stop the Augmentin and start the Clindamycin Take Tylenol 1000 mg every 8 hours between Toradol doses     [facility] Emergency Department Dental Options Sheet  [place] Department of Health and Human Services - Local Safety Net Dental Clinics [email or web address]  [facility] Clinic [phone] This clinic caters to the indigent population and is on a lottery system.  Location: [employer] [facility], [employer], [address], [place] Clinic Hours:  Wednesdays from 6pm - 9pm, patients seen by a lottery system. For dates, call or go to [email or web address] Services:  Cleanings, fillings and simple extractions. Payment Options:  DENTAL WORK IS FREE OF CHARGE. Bring proof of income or support. Best way to get seen:  Arrive at 5:15 pm - this is a lottery, NOT first come/first serve, so arriving earlier will not increase your chances of being seen.  [facility] [phone] Select option 1 for emergencies  Location:  [facility], [address], [address] [place] Clinic Hours:  No walk-ins accepted - call the day before to schedule an appointment. Check in times are 9:30 am and 1:30 pm. Services:  Simple extractions, temporary fillings, pulpectomy/pulp debridement, uncomplicated abscess drainage. Payment Options: PAYMENT IS DUE AT THE TIME OF SERVICE. Fee is usually $100-200, additional surgical procedures (e.g. abscess drainage) may be extra. Cash, checks, Visa/MasterCard accepted. Can file Medicaid if patient is covered for dental - patient should call case worker to check. No discount for [facility] patients. Best way to get seen:  MUST call the day before and get onto the schedule. Can usually be seen the next 1-2 days. No walk-ins accepted.  [facility] Dental Services [phone]  Location:  [facility], [address], [place] Clinic  Hours:  M, W, Th, F 8am or 1:30pm, Tues 9a or 1:30 - first come/first served. Services:  Simple extractions, temporary fillings, uncomplicated abscess drainage. You do not need to be an [facility] resident. Payment Options:  PAYMENT IS DUE AT THE TIME OF SERVICE. Dental insurance, otherwise sliding scale - bring proof of income or support. Depending on income and treatment needed, cost is usually $50-200. Best way to get seen:  Arrive early as it is first come/first served.  [facility] Clinic [phone]  Location:  [address] Clinic Hours: Mon-Thu 8a-5p Services:  Most basic dental services including extractions and fillings. Payment Options:  PAYMENT IS DUE AT THE TIME OF SERVICE. Sliding scale, up to 50% off - bring proof if income or support. Medicaid with dental option accepted. Best way to get seen: Call to schedule an appointment, can usually be seen within 2 weeks OR they will try to see walk-ins - show up at 8a or 2p (you may have to wait).  [facility] Clinic [phone] [place] RESIDENTS ONLY  Location:  [facility], [address], [place], [place] [address] Clinic Hours: By appointment only. Monday - Thursday 8am-5pm, Friday 8am-12pm Services: Cleanings, fillings, extractions. Payment Options: PAYMENT IS DUE AT THE TIME OF SERVICE. Cash, Visa or MasterCard. Sliding scale - $30 minimum per service. Best way to get seen:  Come in to office, complete packet and make an appointment - need proof of income or support monies for each household member and proof of [facility] residence. Usually takes about a month to get in.  [facility] Dental Clinic [phone]  Location:  [address], [facility] Clinic Hours: Walk-in Urgent Care Dental  Services are offered Monday-Friday mornings only. The numbers of emergencies accepted daily is limited to the number  of providers available. ? Maximum 15 - Mondays, Wednesdays & Thursdays ? Maximum 10 - Tuesdays & Fridays Services:  You do not need to be a [facility] resident to be seen for a dental emergency. Emergencies are defined as pain, swelling, abnormal bleeding, or dental trauma. Walkins will receive x-rays if needed. NOTE: Dental cleaning is not an emergency. Payment Options:  PAYMENT IS DUE AT THE TIME OF SERVICE. Minimum co-pay is $40.00 for uninsured patients. Minimum co-pay is $3.00 for Medicaid with dental coverage. Dental Insurance is accepted and must be presented at time of visit. Medicare does not cover dental. Forms of payment: Cash, credit card, checks. Best way to get seen:  If not previously registered with the clinic, walk-in dental registration begins at 7:15 am and is on a first come/first serve basis. If previously registered with the clinic, call to make an appointment.  [facility] [phone] [place] RESIDENTS ONLY  Location:  [address], [place], [place] Clinic Hours:  Mon-Thu 10a-2p Services: Extractions only! Payment Options:  FREE (donations accepted) - bring proof of income or support Best way to get seen:  Call and schedule an appointment OR come at 8am on the 1st Monday of every month (except for holidays) when it is first come/first served.  [facility] [phone]  Location:  [address] [place], [place] Clinic Hours:  Friday mornings Services, Payment Options, Best way to get seen:  Call for info  [facility] Clinic [phone] Location:  [facility], [address], [place], [place] - Ground floor main hospital Clinic Hours:  Appointments are by referral only. M-Th 8:30-5p, Fri 9a-1p, closed weekends/holidays. Services:  Patients with complex medical/developmental conditions (e.g. hematologic problems including sickle cell, autism/MR). Payment Options:  Dental insurance, Medicaid, cash/check/credit  card. 25% discount for [facility] patients. Best way to get seen:  Appointments are by referral only - have the referring physician fax a referral. Usual wait time: A few weeks to a month - they may be able to work a patient into the schedule sooner for more urgent needs.

## 2022-01-08 NOTE — ED Provider Notes (Signed)
[facility]    CSN: [id number] Arrival date & time: 01/08/22  1442      History   Chief Complaint Chief Complaint  Patient presents with   Dental Pain    HPI Donna Howard is a 51 y.o. female who presents saying that her L tongue drained puss and the Augmentin is making her nauseous and wants more pain meds even if it is just 6 of them since OTC meds do not help her pain.  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  States she has been chilling and feels she has had a fever and is concerned she has an infection in her blood stream. Has taken 4 doses of Augmentin so far, but is feeling nausea and decreased appetite.  She has not contacted any of the dentist on the list she was given.     Past Medical History:  Diagnosis Date   Asthma     Patient Active Problem List   Diagnosis Date Noted   Encounter to establish care 08/10/2021   History of asthma 08/10/2021   Chronic lower back pain 08/10/2021   Anxiety 08/10/2021   Smoking 08/10/2021    Past Surgical History:  Procedure Laterality Date   TUBAL LIGATION      OB History   No obstetric history on file.      Home Medications    Prior to Admission medications   Medication Sig Start Date End Date Taking? Authorizing Provider  clindamycin (CLEOCIN) 300 MG capsule Take 1 capsule (300 mg total) by mouth 3 (three) times daily. 01/08/22  Yes Rodriguez-Southworth, Nettie Elm, PA-C  ketorolac (TORADOL) 10 MG tablet Take 1 tablet (10 mg total) by mouth every 6 (six) hours as needed. For dental pain 01/08/22  Yes Rodriguez-Southworth, Nettie Elm, PA-C  albuterol (PROVENTIL HFA) 108 (90 Base) MCG/ACT inhaler Inhale 2 puffs into the lungs once every 6 (six) hours as needed for wheezing or shortness of breath. Patient not taking: Reported on 10/20/2021 08/10/21   Iloabachie, Chioma E, NP  albuterol (VENTOLIN HFA) 108 (90 Base) MCG/ACT inhaler Inhale 1-2 puffs into the lungs every 6 (six) hours as needed for  wheezing or shortness of breath. 10/20/21   Shirlee Latch, PA-C  cetirizine (ZYRTEC) 10 MG tablet Take 1 tablet (10 mg total) by mouth daily. 08/02/15   Cuthriell, Delorise Royals, PA-C  clonazePAM (KLONOPIN) 0.5 MG tablet Take 0.5 mg by mouth 2 (two) times daily as needed. 11/21/21   [provider]  cyclobenzaprine (FLEXERIL) 10 MG tablet Take 1 tablet (10 mg total) by mouth 3 (three) times daily as needed. 08/31/21   Iloabachie, Chioma E, NP  gabapentin (NEURONTIN) 100 MG capsule Take 1 capsule (100 mg total) by mouth 2 (two) times daily. 08/25/21   Iloabachie, Chioma E, NP  hydrOXYzine (ATARAX) 25 MG tablet Take 25 mg by mouth 3 (three) times daily as needed. 11/21/21   [provider]  ipratropium (ATROVENT) 0.06 % nasal spray Place 2 sprays into both nostrils 4 (four) times daily. 10/20/21   Eusebio Friendly B, PA-C  lidocaine (LIDODERM) 5 % Place 1 patch onto the skin every 12 (twelve) hours. Remove and discard patch within 12 hours or as directed by doctor. (Max of 1 patch in 24 hours). Patient not taking: Reported on 08/25/2021 08/10/21   Iloabachie, Chioma E, NP  lidocaine (XYLOCAINE) 2 % solution Use as directed 10 mLs in the mouth or throat every 4 (four) hours as needed  for mouth pain. Gargle and spit 10/05/21   Cuthriell, Delorise Royals, PA-C  Menthol, Topical Analgesic, (BIOFREEZE) 4 % GEL Apply topically. As needed for back pain    [provider]  oxyCODONE-acetaminophen (PERCOCET/ROXICET) 5-325 MG tablet Take 1 tablet by mouth every 6 (six) hours as needed for severe pain. 12/22/21   Becky Augusta, NP  promethazine-dextromethorphan (PROMETHAZINE-DM) 6.25-15 MG/5ML syrup Take 5 mLs by mouth 4 (four) times daily as needed. 10/20/21   Shirlee Latch, PA-C  sertraline (ZOLOFT) 50 MG tablet Take 50 mg by mouth daily. 12/18/21   [provider]    Family History Family History  Problem Relation Age of Onset   Hyperlipidemia Mother    Hypertension Mother    Diabetes Mother         pre-diabetes   Other Father        accidental electrocution   Hypertension Brother    Diabetes Brother    Heart attack Maternal Grandmother    Other Maternal Grandfather        unknown medical history   Diabetes Paternal Grandmother    Heart attack Paternal Grandfather     Social History Social History   Tobacco Use   Smoking status: Every Day    Packs/day: 1.00    Years: 24.00    Total pack years: 24.00    Types: Cigarettes   Smokeless tobacco: Never  Vaping Use   Vaping Use: Never used  Substance Use Topics   Alcohol use: Yes    Comment: occassionally   Drug use: No     Allergies   Patient has no known allergies.   Review of Systems Review of Systems  Constitutional:  Positive for chills. Negative for fever.  HENT:  Positive for dental problem.   Hematological:  Negative for adenopathy.     Physical Exam Triage Vital Signs ED Triage Vitals  Enc Vitals Group     BP 01/08/22 1450 (!) 168/67     Pulse Rate 01/08/22 1450 (!) 103     Resp 01/08/22 1450 14     Temp 01/08/22 1450 98.4 F (36.9 C)     Temp Source 01/08/22 1450 Oral     SpO2 01/08/22 1450 96 %     Weight 01/08/22 1448 134 lb 14.7 oz (61.2 kg)     Height 01/08/22 1448 5\' 7"  (1.702 m)     Head Circumference --      Peak Flow --      Pain Score 01/08/22 1448 9     Pain Loc --      Pain Edu? --      Excl. in GC? --    No data found.  Updated Vital Signs BP (!) 168/67 (BP Location: Right Arm)   Pulse (!) 103   Temp 98.4 F (36.9 C) (Oral)   Resp 14   Ht 5\' 7"  (1.702 m)   Wt 134 lb 14.7 oz (61.2 kg)   LMP 11/21/2021   SpO2 96%   BMI 21.13 kg/m  I repeated her BP and was 126/81 Visual Acuity Right Eye Distance:   Left Eye Distance:   Bilateral Distance:    Right Eye Near:   Left Eye Near:    Bilateral Near:     Physical Exam Vitals and nursing note reviewed.  Constitutional:      Appearance: She is normal weight.     Comments: tearful  HENT:     Right Ear: Tympanic  membrane, ear canal and  external ear normal.     Left Ear: Tympanic membrane, ear canal and external ear normal.     Mouth/Throat:     Comments: Poor dentition with a cracked black molar on the L lower mid gum and circular ulcer on her lateral L tongue. I did not palpate any abscess and there is no erythema. I squeezed this area and no puss drained. There are no signs of abscess on her gums.  Eyes:     General: No scleral icterus.    Conjunctiva/sclera: Conjunctivae normal.  Pulmonary:     Effort: Pulmonary effort is normal.  Musculoskeletal:        General: Normal range of motion.     Cervical back: Neck supple.  Lymphadenopathy:     Cervical: No cervical adenopathy.  Skin:    General: Skin is warm and dry.  Neurological:     Mental Status: She is alert and oriented to person, place, and time.     Gait: Gait normal.  Psychiatric:        Judgment: Judgment normal.     Comments: Tearful and dramatic      UC Treatments / Results  Labs (all labs ordered are listed, but only abnormal results are displayed) Labs Reviewed - No data to display  EKG   Radiology No results found.  Procedures Procedures (including critical care time)  Medications Ordered in UC Medications  cefTRIAXone (ROCEPHIN) injection 1 g (1 g Intramuscular Given 01/08/22 1523)  ketorolac (TORADOL) injection 60 mg (60 mg Intramuscular Given 01/08/22 1522)    Initial Impression / Assessment and Plan / UC Course  I have reviewed the triage vital signs and the nursing notes. She was given Rocephin 1 G IM here Tongue lesion  Carious molar Intolerance to Augmentin  I changed her antibiotic to Clindamycin, I gave her Toraldol pills for pain. Advised her strongly to FU with the list of one of the dentist she was given.   Final Clinical Impressions(s) / UC Diagnoses   Final diagnoses:  Toothache  Tongue sore     Discharge Instructions      Stop the Augmentin and start the Clindamycin Take Tylenol  1000 mg every 8 hours between Toradol doses     [facility] Emergency Department Dental Options Sheet  [place] Department of Health and Human Services - Local Safety Net Dental Clinics [email or web address]  [facility] Clinic [phone] This clinic caters to the indigent population and is on a lottery system.  Location: [employer] [facility], [employer], [address] [place] Clinic Hours:  Wednesdays from 6pm - 9pm, patients seen by a lottery system. For dates, call or go to [email or web address] Services:  Cleanings, fillings and simple extractions. Payment Options:  DENTAL WORK IS FREE OF CHARGE. Bring proof of income or support. Best way to get seen:  Arrive at 5:15 pm - this is a lottery, NOT first come/first serve, so arriving earlier will not increase your chances of being seen.  [facility] [phone] Select option 1 for emergencies  Location:  [facility], [place], [address], [place] Clinic Hours:  No walk-ins accepted - call the day before to schedule an appointment. Check in times are 9:30 am and 1:30 pm. Services:  Simple extractions, temporary fillings, pulpectomy/pulp debridement, uncomplicated abscess drainage. Payment Options: PAYMENT IS DUE AT THE TIME OF SERVICE. Fee is usually $100-200, additional surgical procedures (e.g. abscess drainage) may be extra. Cash, checks, Visa/MasterCard accepted. Can file  Medicaid if patient is covered for dental - patient should call case worker to check. No discount for [facility] patients. Best way to get seen:  MUST call the day before and get onto the schedule. Can usually be seen the next 1-2 days. No walk-ins accepted.  [facility] [phone]  Location:  [facility], [address], [place] Clinic Hours:  M, W, Th, F 8am or 1:30pm, Tues 9a or 1:30 - first  come/first served. Services:  Simple extractions, temporary fillings, uncomplicated abscess drainage. You do not need to be an [facility] resident. Payment Options:  PAYMENT IS DUE AT THE TIME OF SERVICE. Dental insurance, otherwise sliding scale - bring proof of income or support. Depending on income and treatment needed, cost is usually $50-200. Best way to get seen:  Arrive early as it is first come/first served.  [facility] [phone]  Location:  [address] Clinic Hours: Mon-Thu 8a-5p Services:  Most basic dental services including extractions and fillings. Payment Options:  PAYMENT IS DUE AT THE TIME OF SERVICE. Sliding scale, up to 50% off - bring proof if income or support. Medicaid with dental option accepted. Best way to get seen: Call to schedule an appointment, can usually be seen within 2 weeks OR they will try to see walk-ins - show up at 8a or 2p (you may have to wait).  [facility] [phone] [place] RESIDENTS ONLY  Location:  [facility], [address], [place], [place] [id number] Clinic Hours: By appointment only. Monday - Thursday 8am-5pm, Friday 8am-12pm Services: Cleanings, fillings, extractions. Payment Options: PAYMENT IS DUE AT THE TIME OF SERVICE. Cash, Visa or MasterCard. Sliding scale - $30 minimum per service. Best way to get seen:  Come in to office, complete packet and make an appointment - need proof of income or support monies for each household member and proof of [facility] [place] residence. Usually takes about a month to get in.  [facility] [phone]  Location:  [address] [facility] Hours: Walk-in Urgent Care Dental Services are offered Monday-Friday mornings only. The numbers of emergencies accepted daily is limited to the number of providers available. ? Maximum 15 - Mondays, Wednesdays &  Thursdays ? Maximum 10 - Tuesdays & Fridays Services:  You do not need to be a [facility] resident to be seen for a dental emergency. Emergencies are defined as pain, swelling, abnormal bleeding, or dental trauma. Walkins will receive x-rays if needed. NOTE: Dental cleaning is not an emergency. Payment Options:  PAYMENT IS DUE AT THE TIME OF SERVICE. Minimum co-pay is $40.00 for uninsured patients. Minimum co-pay is $3.00 for Medicaid with dental coverage. Dental Insurance is accepted and must be presented at time of visit. Medicare does not cover dental. Forms of payment: Cash, credit card, checks. Best way to get seen:  If not previously registered with the clinic, walk-in dental registration begins at 7:15 am and is on a first come/first serve basis. If previously registered with the clinic, call to make an appointment.  [facility] [phone] [place] RESIDENTS ONLY  Location:  [address], [place], [place] Clinic Hours:  Mon-Thu 10a-2p Services: Extractions only! Payment Options:  FREE (donations accepted) - bring proof of income or support Best way to get seen:  Call and schedule an appointment OR come at 8am on the 1st Monday of every month (except for holidays) when it  is first come/first served.  [facility] [phone]  Location:  [address] [place], [place] Clinic Hours:  Friday mornings Services, Payment Options, Best way to get seen:  Call for info  [facility] Clinic [phone] Location:  [facility], [address] [place], [place] - Ground floor main hospital Clinic Hours:  Appointments are by referral only. M-Th 8:30-5p, Fri 9a-1p, closed weekends/holidays. Services:  Patients with complex medical/developmental conditions (e.g. hematologic problems including sickle cell, autism/MR). Payment Options:  Dental insurance, Medicaid, cash/check/credit card. 25% discount for [facility] patients. Best way to get  seen:  Appointments are by referral only - have the referring physician fax a referral. Usual wait time: A few weeks to a month - they may be able to work a patient into the schedule sooner for more urgent needs.    ED Prescriptions     Medication Sig Dispense Auth. Provider   ketorolac (TORADOL) 10 MG tablet Take 1 tablet (10 mg total) by mouth every 6 (six) hours as needed. For dental pain 20 tablet Rodriguez-Southworth, Nettie Elm, PA-C   clindamycin (CLEOCIN) 300 MG capsule Take 1 capsule (300 mg total) by mouth 3 (three) times daily. 21 capsule Rodriguez-Southworth, Nettie Elm, [place]      I have reviewed the PDMP during this encounter.   Garey Ham, [place] 01/08/22 1527

## 2022-01-08 NOTE — ED Triage Notes (Addendum)
Patient was seen here on 12/22/21 for dental pain.  Patient states that she has finished her antibiotic.  Patient was seen at [facility] ED on Friday.  Patient was then started on Augmentin. Patient states that she has some pus coming out of her tooth on the left bottom.  Patient states that she feels worse.  Patient having chills.

## 2022-02-12 ENCOUNTER — Encounter: Payer: Self-pay | Admitting: Emergency Medicine

## 2022-02-12 ENCOUNTER — Ambulatory Visit
Admission: EM | Admit: 2022-02-12 | Discharge: 2022-02-12 | Disposition: A | Discharge status: Home / Self Care | Payer: 59 | Attending: Emergency Medicine | Admitting: Emergency Medicine

## 2022-02-12 DIAGNOSIS — M5441 Lumbago with sciatica, right side: Secondary | ICD-10-CM | POA: Diagnosis not present

## 2022-02-12 DIAGNOSIS — G8929 Other chronic pain: Secondary | ICD-10-CM

## 2022-02-12 MED ORDER — OXYCODONE-ACETAMINOPHEN 5-325 MG PO TABS: 1.0000 | ORAL_TABLET | Freq: Four times a day (QID) | ORAL | 0 refills | Status: DC | PRN

## 2022-02-12 MED ORDER — KETOROLAC TROMETHAMINE 60 MG/2ML IM SOLN
30.0000 mg | Freq: Once | INTRAMUSCULAR | Status: AC
  Administered 2022-02-12: 30 mg via INTRAMUSCULAR

## 2022-02-12 NOTE — ED Triage Notes (Signed)
Patient c/o lower back pain that has gotten worse over the past 2 days.  Patient has appointment with [facility] on Thursday.  Patient denies injury or fall.

## 2022-02-12 NOTE — ED Provider Notes (Signed)
[facility]    CSN: [id number] Arrival date & time: 02/12/22  1131      History   Chief Complaint Chief Complaint  Patient presents with   Back Pain    HPI Donna Howard is a 51 y.o. female.   HPI  51 year old female here for evaluation of low back pain.  Patient has longstanding history of low back pain for which she has been evaluated by [facility] internal medicine as well as by [facility] primary care.  She has been referred to spine specialist and she states that she has an appointment in 4 days at [facility] pain management.  She states that over the last 2 days she has had increasing low back pain despite taking her prescribed gabapentin.  She states that she is having pain rating down into the outside of her right leg that goes down to her foot.  She denies any saddle anesthesia or loss of bowel or bladder control.  Past Medical History:  Diagnosis Date   Asthma     Patient Active Problem List   Diagnosis Date Noted   Encounter to establish care 08/10/2021   History of asthma 08/10/2021   Chronic lower back pain 08/10/2021   Anxiety 08/10/2021   Smoking 08/10/2021    Past Surgical History:  Procedure Laterality Date   TUBAL LIGATION      OB History   No obstetric history on file.      Home Medications    Prior to Admission medications   Medication Sig Start Date End Date Taking? Authorizing Provider  clonazePAM (KLONOPIN) 0.5 MG tablet Take 0.5 mg by mouth 2 (two) times daily as needed. 11/21/21  Yes [provider]  gabapentin (NEURONTIN) 100 MG capsule Take 1 capsule (100 mg total) by mouth 2 (two) times daily. 08/25/21  Yes Iloabachie, Chioma E, NP  oxyCODONE-acetaminophen (PERCOCET/ROXICET) 5-325 MG tablet Take 1 tablet by mouth every 6 (six) hours as needed for severe pain. 02/12/22  Yes Margarette Canada, NP  sertraline (ZOLOFT) 50 MG tablet Take 50 mg by mouth daily. 12/18/21  Yes [provider]  albuterol (PROVENTIL HFA) 108  (90 Base) MCG/ACT inhaler Inhale 2 puffs into the lungs once every 6 (six) hours as needed for wheezing or shortness of breath. Patient not taking: Reported on 10/20/2021 08/10/21   Iloabachie, Chioma E, NP  albuterol (VENTOLIN HFA) 108 (90 Base) MCG/ACT inhaler Inhale 1-2 puffs into the lungs every 6 (six) hours as needed for wheezing or shortness of breath. 10/20/21   Danton Clap, PA-C  cetirizine (ZYRTEC) 10 MG tablet Take 1 tablet (10 mg total) by mouth daily. 08/02/15   Cuthriell, Charline Bills, PA-C  cyclobenzaprine (FLEXERIL) 10 MG tablet Take 1 tablet (10 mg total) by mouth 3 (three) times daily as needed. 08/31/21   Iloabachie, Chioma E, NP  ipratropium (ATROVENT) 0.06 % nasal spray Place 2 sprays into both nostrils 4 (four) times daily. 10/20/21   Laurene Footman B, PA-C  lidocaine (XYLOCAINE) 2 % solution Use as directed 10 mLs in the mouth or throat every 4 (four) hours as needed for mouth pain. Gargle and spit 10/05/21   Cuthriell, Charline Bills, PA-C  Menthol, Topical Analgesic, (BIOFREEZE) 4 % GEL Apply topically. As needed for back pain    [provider]    Family History Family History  Problem Relation Age of Onset   Hyperlipidemia Mother    Hypertension Mother    Diabetes Mother  pre-diabetes   Other Father        accidental electrocution   Hypertension Brother    Diabetes Brother    Heart attack Maternal Grandmother    Other Maternal Grandfather        unknown medical history   Diabetes Paternal Grandmother    Heart attack Paternal Grandfather     Social History Social History   Tobacco Use   Smoking status: Every Day    Packs/day: 1.00    Years: 24.00    Total pack years: 24.00    Types: Cigarettes   Smokeless tobacco: Never  Vaping Use   Vaping Use: Never used  Substance Use Topics   Alcohol use: Yes    Comment: occassionally   Drug use: No     Allergies   Patient has no known allergies.   Review of Systems Review of Systems   Musculoskeletal:  Positive for back pain.  Neurological:  Positive for numbness. Negative for weakness.     Physical Exam Triage Vital Signs ED Triage Vitals  Enc Vitals Group     BP 02/12/22 1216 129/89     Pulse Rate 02/12/22 1216 88     Resp 02/12/22 1216 14     Temp 02/12/22 1216 97.9 F (36.6 C)     Temp Source 02/12/22 1216 Oral     SpO2 02/12/22 1216 98 %     Weight 02/12/22 1213 130 lb (59 kg)     Height 02/12/22 1213 5\' 7"  (1.702 m)     Head Circumference --      Peak Flow --      Pain Score 02/12/22 1213 10     Pain Loc --      Pain Edu? --      Excl. in [place]? --    No data found.  Updated Vital Signs BP 129/89 (BP Location: Left Arm)   Pulse 88   Temp 97.9 F (36.6 C) (Oral)   Resp 14   Ht 5\' 7"  (1.702 m)   Wt 130 lb (59 kg)   LMP 02/05/2022 (Approximate)   SpO2 98%   BMI 20.36 kg/m   Visual Acuity Right Eye Distance:   Left Eye Distance:   Bilateral Distance:    Right Eye Near:   Left Eye Near:    Bilateral Near:     Physical Exam Vitals and nursing note reviewed.  Constitutional:      Appearance: Normal appearance. She is not ill-appearing.  HENT:     Head: Normocephalic and atraumatic.  Cardiovascular:     Rate and Rhythm: Normal rate and regular rhythm.     Pulses: Normal pulses.     Heart sounds: Normal heart sounds. No murmur heard.    No friction rub. No gallop.  Pulmonary:     Breath sounds: Normal breath sounds. No wheezing, rhonchi or rales.  Musculoskeletal:        General: Tenderness present. No swelling, deformity or signs of injury.  Skin:    General: Skin is warm and dry.     Capillary Refill: Capillary refill takes less than 2 seconds.  Neurological:     General: No focal deficit present.     Mental Status: She is alert and oriented to person, place, and time.     Sensory: No sensory deficit.     Motor: No weakness.     Deep Tendon Reflexes: Reflexes normal.  Psychiatric:        Mood and Affect: Mood  normal.         Behavior: Behavior normal.        Thought Content: Thought content normal.        Judgment: Judgment normal.      UC Treatments / Results  Labs (all labs ordered are listed, but only abnormal results are displayed) Labs Reviewed - No data to display  EKG   Radiology No results found.  Procedures Procedures (including critical care time)  Medications Ordered in UC Medications  ketorolac (TORADOL) injection 30 mg (has no administration in time range)    Initial Impression / Assessment and Plan / UC Course  I have reviewed the triage vital signs and the nursing notes.  Pertinent labs & imaging results that were available during my care of the patient were reviewed by me and considered in my medical decision making (see chart for details).   Patient is a nontoxic-appearing 51 year old female here for evaluation of chronic low back pain with right-sided sciatica that has worsened over the last 2 days.  She does have an upcoming appoint with a spine specialist and pain clinic but she is requesting pain medication to bridge her until that time.  PDMP indicates that she has had 2 narcotic prescription filled in the last 3 months.  On exam patient has tenderness diffusely across her low back.  Her bilateral lower extremity strength is 5/5 and her DTRs are 2+ bilaterally.  No spasm noted.  I offered the patient steroids and muscle lectures but she states that she does not take steroids and she has muscle actions at home.  She is requesting a shot for pain and some medication to bridge her.  She states that in the past she has received Toradol.  She has never been evaluated at this urgent care for her low back pain in the past.  I will order 30 mg of IM Toradol and discharge her home with 10 tablets of Percocet with the clear understanding that we will not continue to supply narcotics for her low back pain that she needs to follow-up with her pain specialist as previously prescribed.   Final  Clinical Impressions(s) / UC Diagnoses   Final diagnoses:  Chronic bilateral low back pain with right-sided sciatica     Discharge Instructions      We have given you a shot of Toradol in clinic to help with inflammation.  You may continue to take Tylenol and ibuprofen at home for mild to moderate pain.  I will give you a single prescription of Percocet tablets to help bridge you until you see your pain specialist on Thursday.  We will not give you any further prescriptions for narcotics from this urgent care.  You have a chronic pain issue and this can be managed by a pain specialist.     ED Prescriptions     Medication Sig Dispense Auth. Provider   oxyCODONE-acetaminophen (PERCOCET/ROXICET) 5-325 MG tablet Take 1 tablet by mouth every 6 (six) hours as needed for severe pain. 10 tablet Becky Augusta, NP      I have reviewed the PDMP during this encounter.   Becky Augusta, NP 02/12/22 1313

## 2022-02-12 NOTE — Discharge Instructions (Addendum)
We have given you a shot of Toradol in clinic to help with inflammation.  You may continue to take Tylenol and ibuprofen at home for mild to moderate pain.  I will give you a single prescription of Percocet tablets to help bridge you until you see your pain specialist on Thursday.  We will not give you any further prescriptions for narcotics from this urgent care.  You have a chronic pain issue and this can be managed by a pain specialist.

## 2022-05-17 ENCOUNTER — Ambulatory Visit: Payer: Self-pay

## 2022-08-02 ENCOUNTER — Ambulatory Visit
Admission: RE | Admit: 2022-08-02 | Discharge: 2022-08-02 | Disposition: A | Discharge status: Home / Self Care | Payer: 59 | Source: Ambulatory Visit | Attending: Family Medicine | Admitting: Family Medicine

## 2022-08-02 ENCOUNTER — Ambulatory Visit (INDEPENDENT_AMBULATORY_CARE_PROVIDER_SITE_OTHER): Payer: 59

## 2022-08-02 ENCOUNTER — Other Ambulatory Visit: Payer: Self-pay

## 2022-08-02 VITALS — BP 100/50 | HR 76 | Temp 98.4°F | Resp 20

## 2022-08-02 DIAGNOSIS — M47819 Spondylosis without myelopathy or radiculopathy, site unspecified: Secondary | ICD-10-CM | POA: Diagnosis not present

## 2022-08-02 DIAGNOSIS — M5416 Radiculopathy, lumbar region: Secondary | ICD-10-CM | POA: Diagnosis not present

## 2022-08-02 MED ORDER — PREDNISONE 10 MG (21) PO TBPK: ORAL_TABLET | Freq: Every day | ORAL | 0 refills | Status: DC

## 2022-08-02 MED ORDER — KETOROLAC TROMETHAMINE 30 MG/ML IJ SOLN
30.0000 mg | Freq: Once | INTRAMUSCULAR | Status: AC
  Administered 2022-08-02: 30 mg via INTRAMUSCULAR

## 2022-08-02 NOTE — ED Provider Notes (Signed)
[facility]    CSN: [id number] Arrival date & time: 08/02/22  1025      History   Chief Complaint Chief Complaint  Patient presents with   Back Pain    My back hurts stuffy nose cough - Entered by patient   Cough    Pt was at work and suddenly started experiencing left lower back pain. Pt also c/o cough, congestion and fever that started 1 wk ago. No fever since last week    HPI  HPI Donna Howard is a 52 y.o. female.   Tinap presents for low back pain that started yesterday around 7 PM while at work. Has history of sciatica on the right side.  Pain radiates down the left side to her thigh. No falls or known direct trauma. She works at [employer] on [address].    Pain is described as throbbing.  Took BC, Gabapentin and Flexeril without relief. Pain rated 8/10.  Endorses leg pain. Denies numbness, tingling, leg weakness, new numbness, new weakness, abdominal pain, incontinence, perianal numbness. Has cough that worsens her back pain. Had congestion and fever last week.    Past Medical History:  Diagnosis Date   Asthma     Patient Active Problem List   Diagnosis Date Noted   Encounter to establish care 08/10/2021   History of asthma 08/10/2021   Chronic lower back pain 08/10/2021   Anxiety 08/10/2021   Smoking 08/10/2021    Past Surgical History:  Procedure Laterality Date   TUBAL LIGATION      OB History   No obstetric history on file.      Home Medications    Prior to Admission medications   Medication Sig Start Date End Date Taking? Authorizing Provider  predniSONE (STERAPRED UNI-PAK 21 TAB) 10 MG (21) TBPK tablet Take by mouth daily. Take 6 tabs by mouth daily for 1, then 5 tabs for 1 day, then 4 tabs for 1 day, then 3 tabs for 1 day, then 2 tabs for 1 day, then 1 tab for 1 day. 08/02/22  Yes Timothey Dahlstrom, DO  albuterol (PROVENTIL HFA) 108 (90 Base) MCG/ACT inhaler Inhale 2 puffs into the lungs once every 6 (six) hours as needed  for wheezing or shortness of breath. Patient not taking: Reported on 10/20/2021 08/10/21   Iloabachie, Chioma E, NP  albuterol (VENTOLIN HFA) 108 (90 Base) MCG/ACT inhaler Inhale 1-2 puffs into the lungs every 6 (six) hours as needed for wheezing or shortness of breath. 10/20/21   Danton Clap, PA-C  cetirizine (ZYRTEC) 10 MG tablet Take 1 tablet (10 mg total) by mouth daily. 08/02/15   Cuthriell, Charline Bills, PA-C  clonazePAM (KLONOPIN) 0.5 MG tablet Take 0.5 mg by mouth 2 (two) times daily as needed. 11/21/21   [provider]  cyclobenzaprine (FLEXERIL) 10 MG tablet Take 1 tablet (10 mg total) by mouth 3 (three) times daily as needed. 08/31/21   Iloabachie, Chioma E, NP  gabapentin (NEURONTIN) 100 MG capsule Take 1 capsule (100 mg total) by mouth 2 (two) times daily. 08/25/21   Iloabachie, Chioma E, NP  ipratropium (ATROVENT) 0.06 % nasal spray Place 2 sprays into both nostrils 4 (four) times daily. 10/20/21   Laurene Footman B, PA-C  lidocaine (XYLOCAINE) 2 % solution Use as directed 10 mLs in the mouth or throat every 4 (four) hours as needed for mouth pain. Gargle and spit 10/05/21   Cuthriell, Charline Bills, PA-C  Menthol, Topical Analgesic, (BIOFREEZE) 4 %  GEL Apply topically. As needed for back pain    [provider]  oxyCODONE-acetaminophen (PERCOCET/ROXICET) 5-325 MG tablet Take 1 tablet by mouth every 6 (six) hours as needed for severe pain. 02/12/22   Margarette Canada, NP  sertraline (ZOLOFT) 50 MG tablet Take 50 mg by mouth daily. 12/18/21   [provider]    Family History Family History  Problem Relation Age of Onset   Hyperlipidemia Mother    Hypertension Mother    Diabetes Mother        pre-diabetes   Other Father        accidental electrocution   Hypertension Brother    Diabetes Brother    Heart attack Maternal Grandmother    Other Maternal Grandfather        unknown medical history   Diabetes Paternal Grandmother    Heart attack Paternal Grandfather      Social History Social History   Tobacco Use   Smoking status: Every Day    Packs/day: 1.00    Years: 24.00    Total pack years: 24.00    Types: Cigarettes   Smokeless tobacco: Never  Vaping Use   Vaping Use: Never used  Substance Use Topics   Alcohol use: Yes    Comment: occassionally   Drug use: No     Allergies   Patient has no known allergies.   Review of Systems Review of Systems: egative unless otherwise stated in HPI.      Physical Exam Triage Vital Signs ED Triage Vitals  Enc Vitals Group     BP 08/02/22 1105 (!) 100/50     Pulse Rate 08/02/22 1105 76     Resp 08/02/22 1105 20     Temp 08/02/22 1105 98.4 F (36.9 C)     Temp src --      SpO2 08/02/22 1105 100 %     Weight --      Height --      Head Circumference --      Peak Flow --      Pain Score 08/02/22 1103 10     Pain Loc --      Pain Edu? --      Excl. in [place]? --    No data found.  Updated Vital Signs BP (!) 100/50   Pulse 76   Temp 98.4 F (36.9 C)   Resp 20   LMP 07/04/2022 (Approximate)   SpO2 100%   Visual Acuity Right Eye Distance:   Left Eye Distance:   Bilateral Distance:    Right Eye Near:   Left Eye Near:    Bilateral Near:     Physical Exam GEN: well appearing female in no acute distress  CVS: well perfused, regular rate and rhythm   RESP: speaking in full sentences without pause, no respiratory distress, CTA bilaterally   MSK:  Lumbar spine: - Inspection: no gross deformity or asymmetry, swelling or ecchymosis. No skin changes  - Palpation: + TTP over the midline spinous processes with bilateral lumbar paraspinal muscle tenderness, no SI joint tenderness bilaterally - ROM: limited ROM in flexion and extension due to acute pain, pt leaning forward for comfort  - Strength: 5/5 strength of lower extremity in L4-S1 nerve root distributions b/l - Neuro: sensation intact in the L4-S1 nerve root distribution b/l - Special testing: positive straight leg raise on  left SKIN: warm, dry, no overly skin rash or erythema    UC Treatments / Results  Labs (all  labs ordered are listed, but only abnormal results are displayed) Labs Reviewed - No data to display  EKG   Radiology DG Lumbar Spine Complete  Result Date: 08/02/2022 CLINICAL DATA:  Back and radiating left hip pain. EXAM: LUMBAR SPINE - COMPLETE 4+ VIEW COMPARISON:  05/18/2021 FINDINGS: Mild stable right convex lumbar scoliotic curvature. No acute bony findings. The lumbar vertebral bodies are normally aligned on the lateral film. Disc spaces are fairly well preserved. There are age advanced facet degenerative changes in the lower lumbar spine. No definite pars defects. Moderate degenerative disc disease noted at T11-12. The visualized bony pelvis is intact. The SI joints are maintained. Incidental moderate stool burden. IMPRESSION: 1. Mild right convex lumbar scoliotic curvature. 2. Age advanced facet degenerative changes in the lower lumbar spine. 3. No acute bony findings. Electronically Signed   By: Marijo Sanes M.D.   On: 08/02/2022 12:35    Procedures Procedures (including critical care time)  Medications Ordered in UC Medications  ketorolac (TORADOL) 30 MG/ML injection 30 mg (30 mg Intramuscular Given 08/02/22 1220)    Initial Impression / Assessment and Plan / UC Course  I have reviewed the triage vital signs and the nursing notes.  Pertinent labs & imaging results that were available during my care of the patient were reviewed by me and considered in my medical decision making (see chart for details).      Pt is a 52 y.o.  female with acute on chronic low back pain. Obtained updated lumbar plain films as she has midline spinous process tenderness.  Xray personally reviewed by me were unremarkable for fracture, significant malalignment or dislocation.  Radiologist agrees age advanced facet degeneration of the lumbar spine with mild right convex lumbar scoliotic curvature. Pt given  Toradol IM injection with some relief.   Patient to gradually return to normal activities, as tolerated and continue ordinary activities within the limits permitted by pain. Prescribed steroid taper  for pain relief.  Continue home Gabapentin and Flexeril. Tylenol and Lidocaine patches PRN for multimodal pain relief. PDMP reviewed and pt taking Suboxone and Klonipin.  Counseled patient on red flag symptoms and when to seek immediate care.  Patient to follow up with orthopedic provider if symptoms do not improve with conservative treatment.  Return and ED precautions given.    Discussed MDM, treatment plan and plan for follow-up with patient who agrees with plan.   Final Clinical Impressions(s) / UC Diagnoses   Final diagnoses:  Degenerative arthropathy of spinal facet joint  Left lumbar radiculopathy     Discharge Instructions      Your x-rays showed some arthritis of some of the bones in your lower back.  There was no fracture or dislocated bones.  Normal alignment as well.  For pain you were given an injection of pain medication here.  I sent some steroids to your pharmacy to help with the inflammation of the muscles and nerves.  Take Tylenol 1500 mg twice a day as needed for pain.  Continue taking gabapentin, Flexeril as previously prescribed.  Follow-up with your orthopedic provider in the next 2 weeks if your pain does not improve.  If your pain worsens, you may need to have advanced imaging that we cannot perform here.  Go to the emergency department for that.       ED Prescriptions     Medication Sig Dispense Auth. Provider   predniSONE (STERAPRED UNI-PAK 21 TAB) 10 MG (21) TBPK tablet Take by mouth daily. Take 6  tabs by mouth daily for 1, then 5 tabs for 1 day, then 4 tabs for 1 day, then 3 tabs for 1 day, then 2 tabs for 1 day, then 1 tab for 1 day. 21 tablet Josede Cicero, DO      I have reviewed the PDMP during this encounter.   Lyndee Hensen, DO 08/02/22 1930

## 2022-08-02 NOTE — Discharge Instructions (Addendum)
Your x-rays showed some arthritis of some of the bones in your lower back.  There was no fracture or dislocated bones.  Normal alignment as well.  For pain you were given an injection of pain medication here.  I sent some steroids to your pharmacy to help with the inflammation of the muscles and nerves.  Take Tylenol 1500 mg twice a day as needed for pain.  Continue taking gabapentin, Flexeril as previously prescribed.  Follow-up with your orthopedic provider in the next 2 weeks if your pain does not improve.  If your pain worsens, you may need to have advanced imaging that we cannot perform here.  Go to the emergency department for that.

## 2022-08-02 NOTE — ED Triage Notes (Signed)
Pt was at work and suddenly started experiencing left lower back pain that radiates to but and left hip. No relief from OTC meds and home muscle relaxer. Pt also c/o cough, congestion and fever that started 1 wk ago. No fever since last week

## 2022-10-24 ENCOUNTER — Ambulatory Visit
Admission: EM | Admit: 2022-10-24 | Discharge: 2022-10-24 | Disposition: A | Discharge status: Home / Self Care | Payer: 59

## 2022-10-24 ENCOUNTER — Ambulatory Visit (INDEPENDENT_AMBULATORY_CARE_PROVIDER_SITE_OTHER): Payer: 59

## 2022-10-24 DIAGNOSIS — M7918 Myalgia, other site: Secondary | ICD-10-CM

## 2022-10-24 DIAGNOSIS — M25571 Pain in right ankle and joints of right foot: Secondary | ICD-10-CM

## 2022-10-24 DIAGNOSIS — W19XXXA Unspecified fall, initial encounter: Secondary | ICD-10-CM | POA: Diagnosis not present

## 2022-10-24 HISTORY — DX: Anxiety disorder, unspecified: F41.9

## 2022-10-24 HISTORY — DX: Dorsalgia, unspecified: M54.9

## 2022-10-24 MED ORDER — KETOROLAC TROMETHAMINE 30 MG/ML IJ SOLN
30.0000 mg | Freq: Once | INTRAMUSCULAR | Status: AC
  Administered 2022-10-24: 30 mg via INTRAMUSCULAR

## 2022-10-24 NOTE — Discharge Instructions (Addendum)
Your xray was negative for acute findings. Rest,ice,elevate,wear ace wrap for support. May take over the counter ibuprofen as label directed for pain. Take home meds as prescribed. Follow up with PCP.

## 2022-10-24 NOTE — ED Provider Notes (Signed)
[facility]    CSN: [id number] Arrival date & time: 10/24/22  1354      History   Chief Complaint Chief Complaint  Patient presents with   Fall    HPI Donna Howard is a 52 y.o. female.   52 year old female, Donna Howard, presents to urgent care for complaint of right ankle pain after falling yesterday morning.  Patient states she is sore all over, patient requesting pain shot.  Patient takes gabapentin and Percocet for pain.  The history is provided by the patient. No language interpreter was used.    Past Medical History:  Diagnosis Date   Anxiety    Asthma    Asthma    Back pain     Patient Active Problem List   Diagnosis Date Noted   Fall 10/24/2022   Acute right ankle pain 10/24/2022   Musculoskeletal pain 10/24/2022   Encounter to establish care 08/10/2021   History of asthma 08/10/2021   Chronic lower back pain 08/10/2021   Anxiety 08/10/2021   Smoking 08/10/2021    Past Surgical History:  Procedure Laterality Date   TUBAL LIGATION      OB History   No obstetric history on file.      Home Medications    Prior to Admission medications   Medication Sig Start Date End Date Taking? Authorizing Provider  albuterol (PROVENTIL HFA) 108 (90 Base) MCG/ACT inhaler Inhale 2 puffs into the lungs once every 6 (six) hours as needed for wheezing or shortness of breath. 08/10/21  Yes Iloabachie, Chioma E, NP  cetirizine (ZYRTEC) 10 MG tablet Take 1 tablet (10 mg total) by mouth daily. 08/02/15  Yes Cuthriell, Delorise Royals, PA-C  clonazePAM (KLONOPIN) 0.5 MG tablet Take 0.5 mg by mouth 2 (two) times daily as needed. 11/21/21  Yes [provider]  cyclobenzaprine (FLEXERIL) 10 MG tablet Take 1 tablet (10 mg total) by mouth 3 (three) times daily as needed. 08/31/21  Yes Iloabachie, Chioma E, NP  gabapentin (NEURONTIN) 100 MG capsule Take 1 capsule (100 mg total) by mouth 2 (two) times daily. 08/25/21  Yes Iloabachie, Chioma E, NP  Menthol, Topical  Analgesic, (BIOFREEZE) 4 % GEL Apply topically. As needed for back pain   Yes [provider]  oxyCODONE-acetaminophen (PERCOCET/ROXICET) 5-325 MG tablet Take 1 tablet by mouth every 6 (six) hours as needed for severe pain. 02/12/22  Yes Becky Augusta, NP  QUEtiapine (SEROQUEL) 100 MG tablet Take 100 mg by mouth at bedtime.   Yes [provider]  sertraline (ZOLOFT) 50 MG tablet Take 50 mg by mouth daily. 12/18/21  Yes [provider]  albuterol (VENTOLIN HFA) 108 (90 Base) MCG/ACT inhaler Inhale 1-2 puffs into the lungs every 6 (six) hours as needed for wheezing or shortness of breath. 10/20/21   Eusebio Friendly B, PA-C  ipratropium (ATROVENT) 0.06 % nasal spray Place 2 sprays into both nostrils 4 (four) times daily. 10/20/21   Eusebio Friendly B, PA-C  lidocaine (XYLOCAINE) 2 % solution Use as directed 10 mLs in the mouth or throat every 4 (four) hours as needed for mouth pain. Gargle and spit 10/05/21   Cuthriell, Delorise Royals, PA-C  predniSONE (STERAPRED UNI-PAK 21 TAB) 10 MG (21) TBPK tablet Take by mouth daily. Take 6 tabs by mouth daily for 1, then 5 tabs for 1 day, then 4 tabs for 1 day, then 3 tabs for 1 day, then 2 tabs for 1 day, then 1 tab for 1 day. 08/02/22  Katha Cabal, DO    Family History Family History  Problem Relation Age of Onset   Hyperlipidemia Mother    Hypertension Mother    Diabetes Mother        pre-diabetes   Other Father        accidental electrocution   Hypertension Brother    Diabetes Brother    Heart attack Maternal Grandmother    Other Maternal Grandfather        unknown medical history   Diabetes Paternal Grandmother    Heart attack Paternal Grandfather     Social History Social History   Tobacco Use   Smoking status: Every Day    Packs/day: 1.00    Years: 24.00    Additional pack years: 0.00    Total pack years: 24.00    Types: Cigarettes   Smokeless tobacco: Never  Vaping Use   Vaping Use: Never used  Substance Use Topics    Alcohol use: Yes    Comment: occ   Drug use: No     Allergies   Patient has no known allergies.   Review of Systems Review of Systems  Musculoskeletal:  Positive for arthralgias, joint swelling and myalgias.  All other systems reviewed and are negative.    Physical Exam Triage Vital Signs ED Triage Vitals  Enc Vitals Group     BP 10/24/22 1530 99/66     Pulse Rate 10/24/22 1530 90     Resp 10/24/22 1530 16     Temp 10/24/22 1530 98.2 F (36.8 C)     Temp Source 10/24/22 1530 Oral     SpO2 10/24/22 1530 100 %     Weight --      Height --      Head Circumference --      Peak Flow --      Pain Score 10/24/22 1522 9     Pain Loc --      Pain Edu? --      Excl. in GC? --    No data found.  Updated Vital Signs BP 99/66 (BP Location: Left Arm)   Pulse 90   Temp 98.2 F (36.8 C) (Oral)   Resp 16   SpO2 100%   Visual Acuity Right Eye Distance:   Left Eye Distance:   Bilateral Distance:    Right Eye Near:   Left Eye Near:    Bilateral Near:     Physical Exam Vitals and nursing note reviewed.  Musculoskeletal:     Right ankle: Swelling present. Tenderness present over the lateral malleolus. Normal range of motion. Normal pulse.  Neurological:     General: No focal deficit present.     Mental Status: She is alert and oriented to person, place, and time.     GCS: GCS eye subscore is 4. GCS verbal subscore is 5. GCS motor subscore is 6.     Sensory: Sensation is intact.     Motor: Motor function is intact.     Coordination: Coordination is intact.     Gait: Gait is intact.      UC Treatments / Results  Labs (all labs ordered are listed, but only abnormal results are displayed) Labs Reviewed - No data to display  EKG   Radiology DG Ankle Complete Right  Result Date: 10/24/2022 CLINICAL DATA:  Right ankle pain after fall 10/23/2022. EXAM: RIGHT ANKLE - COMPLETE 3+ VIEW COMPARISON:  None Available. FINDINGS: Normal bone mineralization. The ankle  mortise is symmetric and  intact. Joint spaces are preserved. No acute fracture is seen. No dislocation. IMPRESSION: Normal right ankle radiographs. Electronically Signed   By: Neita Garnet M.D.   On: 10/24/2022 16:45    Procedures Procedures (including critical care time)  Medications Ordered in UC Medications  ketorolac (TORADOL) 30 MG/ML injection 30 mg (30 mg Intramuscular Given 10/24/22 1619)    Initial Impression / Assessment and Plan / UC Course  I have reviewed the triage vital signs and the nursing notes.  Pertinent labs & imaging results that were available during my care of the patient were reviewed by me and considered in my medical decision making (see chart for details).  Clinical Course as of 10/24/22 2213  Tue Oct 24, 2022  1553 Toradol 30 mg IM for pain,xray right ankle ordered.  [JD]  1724 Xray is negative [JD]    Clinical Course User Index [JD] Eternity Dexter, NP    CLINICAL DATA: Right ankle pain after fall 10/23/2022.  EXAM: RIGHT ANKLE - COMPLETE 3+ VIEW  COMPARISON: None Available.  FINDINGS: Normal bone mineralization. The ankle mortise is symmetric and intact. Joint spaces are preserved. No acute fracture is seen. No dislocation.  IMPRESSION: Normal right ankle radiographs.   Electronically Signed By: Neita Garnet M.D. On: 10/24/2022 16:45   Ddx:Fall, musculoskeletal pain Final Clinical Impressions(s) / UC Diagnoses   Final diagnoses:  Fall, initial encounter  Acute right ankle pain  Musculoskeletal pain     Discharge Instructions      Your xray was negative for acute findings. Rest,ice,elevate,wear ace wrap for support. May take over the counter ibuprofen as label directed for pain. Take home meds as prescribed. Follow up with PCP.     ED Prescriptions   None    PDMP not reviewed this encounter.   Clancy Gourd, NP 10/24/22 2213

## 2022-10-24 NOTE — ED Triage Notes (Signed)
Pt states she works long hours and believes she must have been tired and fell down the stairs.  Reports hurting "everywhere". Has R ankle pain, hit head (did not pass out), bilateral hand pain, R upper arm pain. Pt asks for a shot of something for pain.

## 2022-11-13 ENCOUNTER — Encounter: Payer: Self-pay | Admitting: Emergency Medicine

## 2022-11-13 ENCOUNTER — Emergency Department
Admission: EM | Admit: 2022-11-13 | Discharge: 2022-11-13 | Disposition: A | Discharge status: Home / Self Care | Payer: 59 | Attending: Emergency Medicine | Admitting: Emergency Medicine

## 2022-11-13 ENCOUNTER — Other Ambulatory Visit: Payer: Self-pay

## 2022-11-13 DIAGNOSIS — X58XXXA Exposure to other specified factors, initial encounter: Secondary | ICD-10-CM | POA: Diagnosis not present

## 2022-11-13 DIAGNOSIS — K0889 Other specified disorders of teeth and supporting structures: Secondary | ICD-10-CM

## 2022-11-13 DIAGNOSIS — S025XXA Fracture of tooth (traumatic), initial encounter for closed fracture: Secondary | ICD-10-CM | POA: Insufficient documentation

## 2022-11-13 DIAGNOSIS — J45909 Unspecified asthma, uncomplicated: Secondary | ICD-10-CM | POA: Insufficient documentation

## 2022-11-13 MED ORDER — HYDROCODONE-ACETAMINOPHEN 5-325 MG PO TABS
1.0000 | ORAL_TABLET | Freq: Once | ORAL | Status: AC
  Administered 2022-11-13: 1 via ORAL
  Filled 2022-11-13: qty 1

## 2022-11-13 MED ORDER — HYDROCODONE-ACETAMINOPHEN 5-325 MG PO TABS: 1.0000 | ORAL_TABLET | ORAL | 0 refills | Status: DC | PRN

## 2022-11-13 MED ORDER — ONDANSETRON 4 MG PO TBDP
4.0000 mg | ORAL_TABLET | Freq: Once | ORAL | Status: AC
  Administered 2022-11-13: 4 mg via ORAL
  Filled 2022-11-13: qty 1

## 2022-11-13 MED ORDER — CLINDAMYCIN HCL 300 MG PO CAPS: 300.0000 mg | ORAL_CAPSULE | Freq: Three times a day (TID) | ORAL | 0 refills | Status: AC

## 2022-11-13 MED ORDER — CLINDAMYCIN HCL 150 MG PO CAPS
300.0000 mg | ORAL_CAPSULE | Freq: Once | ORAL | Status: AC
  Administered 2022-11-13: 300 mg via ORAL
  Filled 2022-11-13: qty 2

## 2022-11-13 NOTE — ED Provider Notes (Signed)
   [facility] Provider Note    Event Date/Time   First MD Initiated Contact with Patient 11/13/22 2130     (approximate)  History   Chief Complaint: Dental Pain  HPI  Donna Howard is a 52 y.o. female with a past medical history of anxiety, asthma, back pain, presents to the emergency department for left lower dental pain.  According to the patient on Friday she fractured her tooth which she has had an infection and for quite some time.  States since fracturing the tooth the pain has been considerably worse.  Patient feels like it is beginning to swell as well so she came to the emergency department for evaluation.  Physical Exam   Triage Vital Signs: ED Triage Vitals  Enc Vitals Group     BP 11/13/22 2055 (!) 138/100     Pulse Rate 11/13/22 2055 78     Resp 11/13/22 2055 18     Temp 11/13/22 2055 97.9 F (36.6 C)     Temp Source 11/13/22 2055 Oral     SpO2 11/13/22 2055 100 %     Weight 11/13/22 2053 135 lb (61.2 kg)     Height 11/13/22 2053 5\' 7"  (1.702 m)     Head Circumference --      Peak Flow --      Pain Score 11/13/22 2055 10     Pain Loc --      Pain Edu? --      Excl. in GC? --     Most recent vital signs: Vitals:   11/13/22 2055  BP: (!) 138/100  Pulse: 78  Resp: 18  Temp: 97.9 F (36.6 C)  SpO2: 100%    General: Awake, no distress.  CV:  Good peripheral perfusion.  Regular rate and rhythm  Resp:  Normal effort.  Equal breath sounds bilaterally.  Abd:  No distention Other:  Patient has a fractured tooth to the left lower molar with some tenderness around the gums but no sign of any abscess.   ED Results / Procedures / Treatments    MEDICATIONS ORDERED IN ED: Medications  clindamycin (CLEOCIN) capsule 300 mg (has no administration in time range)  HYDROcodone-acetaminophen (NORCO/VICODIN) 5-325 MG per tablet 1 tablet (has no administration in time range)     IMPRESSION / MDM / ASSESSMENT AND PLAN / ED COURSE  I  reviewed the triage vital signs and the nursing notes.  Patient's presentation is most consistent with acute illness / injury with system symptoms.  Patient presents emergency department for dental pain after sustaining a dental fracture.  Discussed with the patient the importance of following up with a dentist as soon as possible we will provide a list of local dentist.  Patient has tenderness along the gingiva but no sign of abscess.  Will cover with clindamycin 3 times daily x 10 days.  Will also cover with a short course of pain medication.  Discussed return precautions as well as follow-up dental options.  Patient agreeable to plan of care.  FINAL CLINICAL IMPRESSION(S) / ED DIAGNOSES   Dental fracture Dental pain  Rx / DC Orders   Norco Clindamycin  Note:  This document was prepared using Dragon voice recognition software and may include unintentional dictation errors.   Minna Antis, MD 11/13/22 2140

## 2022-11-13 NOTE — ED Triage Notes (Signed)
Pt presents ambulatory to triage via POV with complaints of L posterior dental pain x 3 days. Pt notes breaking her tooth this weekend and the pain has been more intense. She has tried [facility] powder with minimal relief. A&Ox4 at this time. Denies CP or SOB.

## 2023-01-08 ENCOUNTER — Encounter: Payer: Self-pay | Admitting: Emergency Medicine

## 2023-01-08 ENCOUNTER — Ambulatory Visit
Admission: EM | Admit: 2023-01-08 | Discharge: 2023-01-08 | Disposition: A | Discharge status: Home / Self Care | Payer: Medicaid Other | Attending: Emergency Medicine | Admitting: Emergency Medicine

## 2023-01-08 DIAGNOSIS — J45909 Unspecified asthma, uncomplicated: Secondary | ICD-10-CM | POA: Diagnosis not present

## 2023-01-08 DIAGNOSIS — J069 Acute upper respiratory infection, unspecified: Secondary | ICD-10-CM | POA: Insufficient documentation

## 2023-01-08 DIAGNOSIS — B9789 Other viral agents as the cause of diseases classified elsewhere: Secondary | ICD-10-CM | POA: Diagnosis not present

## 2023-01-08 DIAGNOSIS — Z1152 Encounter for screening for COVID-19: Secondary | ICD-10-CM | POA: Insufficient documentation

## 2023-01-08 DIAGNOSIS — R059 Cough, unspecified: Secondary | ICD-10-CM | POA: Insufficient documentation

## 2023-01-08 LAB — SARS CORONAVIRUS 2 BY RT PCR: SARS Coronavirus 2 by RT PCR: NEGATIVE

## 2023-01-08 MED ORDER — PROMETHAZINE-DM 6.25-15 MG/5ML PO SYRP: 5.0000 mL | ORAL_SOLUTION | Freq: Four times a day (QID) | ORAL | 0 refills | Status: DC | PRN

## 2023-01-08 MED ORDER — BENZONATATE 100 MG PO CAPS: 200.0000 mg | ORAL_CAPSULE | Freq: Three times a day (TID) | ORAL | 0 refills | Status: DC

## 2023-01-08 MED ORDER — IPRATROPIUM BROMIDE 0.06 % NA SOLN: 2.0000 | Freq: Four times a day (QID) | NASAL | 12 refills | Status: DC

## 2023-01-08 MED ORDER — IPRATROPIUM BROMIDE 0.06 % NA SOLN: 2.0000 | Freq: Four times a day (QID) | NASAL | 12 refills | Status: AC

## 2023-01-08 NOTE — Discharge Instructions (Signed)
Your COVID test today was negative, this means that you have completed your infectious window and you are no longer infectious.  Please use the following medications to help with your symptoms.  Continue to use your albuterol inhaler, 1 to 2 puffs every 4-6 hours, as needed for shortness of breath or wheezing.  Use over-the-counter Tylenol and/or ibuprofen according to the package instructions as needed for fever or bodyaches.  Use the Atrovent nasal spray, 2 squirts in each nostril every 6 hours, as needed for runny nose and postnasal drip.  Use the Tessalon Perles every 8 hours during the day.  Take them with a small sip of water.  They may give you some numbness to the base of your tongue or a metallic taste in your mouth, this is normal.  Use the Promethazine DM cough syrup at bedtime for cough and congestion.  It will make you drowsy so do not take it during the day.  Return for reevaluation or see your primary care provider for any new or worsening symptoms.

## 2023-01-08 NOTE — ED Provider Notes (Signed)
[facility]    CSN: [id number] Arrival date & time: 01/08/23  1191      History   Chief Complaint Chief Complaint  Patient presents with   Nasal Congestion   Fatigue   Sinus pressure    Cough    HPI Donna Howard is a 52 y.o. female.   HPI  52 year old female with a past medical history significant for asthma, back pain, and anxiety presents for evaluation of 4 days worth of nasal congestion, sinus pressure, fatigue, and cough.  She reports that she tested COVID-positive at home the day her symptoms began, this past Thursday.  Past Medical History:  Diagnosis Date   Anxiety    Asthma    Asthma    Back pain     Patient Active Problem List   Diagnosis Date Noted   Fall 10/24/2022   Acute right ankle pain 10/24/2022   Musculoskeletal pain 10/24/2022   Encounter to establish care 08/10/2021   History of asthma 08/10/2021   Chronic lower back pain 08/10/2021   Anxiety 08/10/2021   Smoking 08/10/2021    Past Surgical History:  Procedure Laterality Date   TUBAL LIGATION      OB History   No obstetric history on file.      Home Medications    Prior to Admission medications   Medication Sig Start Date End Date Taking? Authorizing Provider  benzonatate (TESSALON) 100 MG capsule Take 2 capsules (200 mg total) by mouth every 8 (eight) hours. 01/08/23  Yes Becky Augusta, NP  ipratropium (ATROVENT) 0.06 % nasal spray Place 2 sprays into both nostrils 4 (four) times daily. 01/08/23  Yes Becky Augusta, NP  promethazine-dextromethorphan (PROMETHAZINE-DM) 6.25-15 MG/5ML syrup Take 5 mLs by mouth 4 (four) times daily as needed. 01/08/23  Yes Becky Augusta, NP  albuterol (PROVENTIL HFA) 108 (90 Base) MCG/ACT inhaler Inhale 2 puffs into the lungs once every 6 (six) hours as needed for wheezing or shortness of breath. 08/10/21   Iloabachie, Chioma E, NP  albuterol (VENTOLIN HFA) 108 (90 Base) MCG/ACT inhaler Inhale 1-2 puffs into the lungs every 6 (six) hours as needed  for wheezing or shortness of breath. 10/20/21   Shirlee Latch, PA-C  cetirizine (ZYRTEC) 10 MG tablet Take 1 tablet (10 mg total) by mouth daily. 08/02/15   Cuthriell, Delorise Royals, PA-C  clonazePAM (KLONOPIN) 0.5 MG tablet Take 0.5 mg by mouth 2 (two) times daily as needed. 11/21/21   [provider]  cyclobenzaprine (FLEXERIL) 10 MG tablet Take 1 tablet (10 mg total) by mouth 3 (three) times daily as needed. 08/31/21   Iloabachie, Chioma E, NP  gabapentin (NEURONTIN) 100 MG capsule Take 1 capsule (100 mg total) by mouth 2 (two) times daily. 08/25/21   Iloabachie, Chioma E, NP  HYDROcodone-acetaminophen (NORCO/VICODIN) 5-325 MG tablet Take 1 tablet by mouth every 4 (four) hours as needed. 11/13/22   Minna Antis, MD  lidocaine (XYLOCAINE) 2 % solution Use as directed 10 mLs in the mouth or throat every 4 (four) hours as needed for mouth pain. Gargle and spit 10/05/21   Cuthriell, Delorise Royals, PA-C  Menthol, Topical Analgesic, (BIOFREEZE) 4 % GEL Apply topically. As needed for back pain    [provider]  oxyCODONE-acetaminophen (PERCOCET/ROXICET) 5-325 MG tablet Take 1 tablet by mouth every 6 (six) hours as needed for severe pain. 02/12/22   Becky Augusta, NP  predniSONE (STERAPRED UNI-PAK 21 TAB) 10 MG (21) TBPK tablet Take by mouth daily. Take 6  tabs by mouth daily for 1, then 5 tabs for 1 day, then 4 tabs for 1 day, then 3 tabs for 1 day, then 2 tabs for 1 day, then 1 tab for 1 day. 08/02/22   Brimage, Seward Meth, DO  QUEtiapine (SEROQUEL) 100 MG tablet Take 100 mg by mouth at bedtime.    [provider]  sertraline (ZOLOFT) 50 MG tablet Take 50 mg by mouth daily. 12/18/21   [provider]    Family History Family History  Problem Relation Age of Onset   Hyperlipidemia Mother    Hypertension Mother    Diabetes Mother        pre-diabetes   Other Father        accidental electrocution   Hypertension Brother    Diabetes Brother    Heart attack Maternal Grandmother     Other Maternal Grandfather        unknown medical history   Diabetes Paternal Grandmother    Heart attack Paternal Grandfather     Social History Social History   Tobacco Use   Smoking status: Every Day    Current packs/day: 1.00    Average packs/day: 1 pack/day for 24.0 years (24.0 ttl pk-yrs)    Types: Cigarettes   Smokeless tobacco: Never  Vaping Use   Vaping status: Never Used  Substance Use Topics   Alcohol use: Yes    Comment: occ   Drug use: No     Allergies   Patient has no known allergies.   Review of Systems Review of Systems  Constitutional:  Positive for diaphoresis and fever.  HENT:  Positive for congestion, rhinorrhea and sinus pressure. Negative for ear pain and sore throat.   Respiratory:  Positive for cough. Negative for shortness of breath and wheezing.      Physical Exam Triage Vital Signs ED Triage Vitals  Encounter Vitals Group     BP      Systolic BP Percentile      Diastolic BP Percentile      Pulse      Resp      Temp      Temp src      SpO2      Weight      Height      Head Circumference      Peak Flow      Pain Score      Pain Loc      Pain Education      Exclude from Growth Chart    No data found.  Updated Vital Signs LMP  (LMP Unknown)   Visual Acuity Right Eye Distance:   Left Eye Distance:   Bilateral Distance:    Right Eye Near:   Left Eye Near:    Bilateral Near:     Physical Exam Vitals and nursing note reviewed.  Constitutional:      Appearance: Normal appearance. She is not ill-appearing.  HENT:     Head: Normocephalic and atraumatic.     Right Ear: Tympanic membrane, ear canal and external ear normal. There is no impacted cerumen.     Left Ear: Tympanic membrane, ear canal and external ear normal. There is no impacted cerumen.     Nose: Congestion and rhinorrhea present.     Comments: Mild edema of nasal mucosa with scant clear discharge in both nares.    Mouth/Throat:     Mouth: Mucous membranes  are moist.     Pharynx: Oropharynx is clear. No posterior  oropharyngeal erythema.  Cardiovascular:     Rate and Rhythm: Normal rate and regular rhythm.     Pulses: Normal pulses.     Heart sounds: Normal heart sounds. No murmur heard.    No friction rub. No gallop.  Pulmonary:     Effort: Pulmonary effort is normal.     Breath sounds: Normal breath sounds. No wheezing, rhonchi or rales.  Skin:    General: Skin is warm and dry.     Capillary Refill: Capillary refill takes less than 2 seconds.     Findings: No rash.  Neurological:     General: No focal deficit present.     Mental Status: She is alert and oriented to person, place, and time.      UC Treatments / Results  Labs (all labs ordered are listed, but only abnormal results are displayed) Labs Reviewed  SARS CORONAVIRUS 2 BY RT PCR    EKG   Radiology No results found.  Procedures Procedures (including critical care time)  Medications Ordered in UC Medications - No data to display  Initial Impression / Assessment and Plan / UC Course  I have reviewed the triage vital signs and the nursing notes.  Pertinent labs & imaging results that were available during my care of the patient were reviewed by me and considered in my medical decision making (see chart for details).   Patient is a nontoxic-appearing 52 year old female presenting for evaluation of respiratory complaints as outlined HPI above.  Her her symptoms began 4 days ago and today her symptoms began she took a home COVID test which was positive.  She states that she took Thursday night off but she came in for evaluation because she is still feeling fatigued and she is having green discharge from both naris.  She denies any wheezing but reports that she has had to use her albuterol inhaler, which has been helping.  On exam she has mild formation of her nasal mucosa but no appreciable discharge or postnasal drip.  Cardiopulmonary exam reveals clear lung sounds in  all fields.  I will order a COVID PCR confirmatory test here in clinic.  COVID PCR is negative.  I will discharge patient on the diagnosis of viral URI with a cough and prescribe Atrovent nasal spray, Tessalon Perles, and Promethazine DM cough syrup.  She can continue to use her albuterol inhaler as needed for shortness of breath or wheezing.  ER and return precautions reviewed.  Work note provided.   Final Clinical Impressions(s) / UC Diagnoses   Final diagnoses:  Viral URI with cough     Discharge Instructions      Your COVID test today was negative, this means that you have completed your infectious window and you are no longer infectious.  Please use the following medications to help with your symptoms.  Continue to use your albuterol inhaler, 1 to 2 puffs every 4-6 hours, as needed for shortness of breath or wheezing.  Use over-the-counter Tylenol and/or ibuprofen according to the package instructions as needed for fever or bodyaches.  Use the Atrovent nasal spray, 2 squirts in each nostril every 6 hours, as needed for runny nose and postnasal drip.  Use the Tessalon Perles every 8 hours during the day.  Take them with a small sip of water.  They may give you some numbness to the base of your tongue or a metallic taste in your mouth, this is normal.  Use the Promethazine DM cough syrup at bedtime for  cough and congestion.  It will make you drowsy so do not take it during the day.  Return for reevaluation or see your primary care provider for any new or worsening symptoms.      ED Prescriptions     Medication Sig Dispense Auth. Provider   benzonatate (TESSALON) 100 MG capsule Take 2 capsules (200 mg total) by mouth every 8 (eight) hours. 21 capsule Becky Augusta, NP   ipratropium (ATROVENT) 0.06 % nasal spray Place 2 sprays into both nostrils 4 (four) times daily. 15 mL Becky Augusta, NP   promethazine-dextromethorphan (PROMETHAZINE-DM) 6.25-15 MG/5ML syrup Take 5 mLs by  mouth 4 (four) times daily as needed. 118 mL Becky Augusta, NP      PDMP not reviewed this encounter.   Becky Augusta, NP 01/08/23 [phone]

## 2023-01-08 NOTE — ED Triage Notes (Signed)
Pt presents with a cough, sinus pressure, nasal congestion and fatigue x 4 days.

## 2023-01-15 ENCOUNTER — Other Ambulatory Visit: Payer: Self-pay

## 2023-01-15 ENCOUNTER — Encounter: Payer: Self-pay | Admitting: Emergency Medicine

## 2023-01-15 ENCOUNTER — Ambulatory Visit
Admission: EM | Admit: 2023-01-15 | Discharge: 2023-01-15 | Disposition: A | Discharge status: Home / Self Care | Payer: Medicaid Other | Attending: Internal Medicine | Admitting: Internal Medicine

## 2023-01-15 DIAGNOSIS — M5441 Lumbago with sciatica, right side: Secondary | ICD-10-CM

## 2023-01-15 DIAGNOSIS — G8929 Other chronic pain: Secondary | ICD-10-CM

## 2023-01-15 MED ORDER — KETOROLAC TROMETHAMINE 30 MG/ML IJ SOLN
30.0000 mg | Freq: Once | INTRAMUSCULAR | Status: AC
  Administered 2023-01-15: 30 mg via INTRAMUSCULAR

## 2023-01-15 NOTE — ED Triage Notes (Signed)
Lower back pain since last night.  Reports a history of back issues.    Has taken gabapentin, muscle relaxer

## 2023-01-15 NOTE — ED Provider Notes (Addendum)
[facility]    CSN: [id number] Arrival date & time: 01/15/23  1209      History   Chief Complaint Chief Complaint  Patient presents with   Back Pain    HPI Donna Howard is a 52 y.o. female presents for evaluation of back pain.  Patient reports history of chronic low back pain and does see a spine specialist.  She reports last night she developed a constant right lower back pain that radiates into her right buttock.  Denies any injury or inciting event.  Denies any numbness/tingling/weakness of her lower extremities, no bowel or bladder incontinence, no saddle paresthesia.  She took her muscle relaxer tizanidine as well as gabapentin for pain.  She states she has been other muscle relaxers that were not effective.  She states she cannot tolerate prednisone.  No other concerns at this time.   Back Pain   Past Medical History:  Diagnosis Date   Anxiety    Asthma    Asthma    Back pain     Patient Active Problem List   Diagnosis Date Noted   Fall 10/24/2022   Acute right ankle pain 10/24/2022   Musculoskeletal pain 10/24/2022   Encounter to establish care 08/10/2021   History of asthma 08/10/2021   Chronic lower back pain 08/10/2021   Anxiety 08/10/2021   Smoking 08/10/2021    Past Surgical History:  Procedure Laterality Date   TUBAL LIGATION      OB History   No obstetric history on file.      Home Medications    Prior to Admission medications   Medication Sig Start Date End Date Taking? Authorizing Provider  albuterol (PROVENTIL HFA) 108 (90 Base) MCG/ACT inhaler Inhale 2 puffs into the lungs once every 6 (six) hours as needed for wheezing or shortness of breath. 08/10/21   Iloabachie, Chioma E, NP  albuterol (VENTOLIN HFA) 108 (90 Base) MCG/ACT inhaler Inhale 1-2 puffs into the lungs every 6 (six) hours as needed for wheezing or shortness of breath. 10/20/21   Shirlee Latch, PA-C  benzonatate (TESSALON) 100 MG capsule Take 2 capsules (200 mg  total) by mouth every 8 (eight) hours. 01/08/23   Becky Augusta, NP  cetirizine (ZYRTEC) 10 MG tablet Take 1 tablet (10 mg total) by mouth daily. 08/02/15   Cuthriell, Delorise Royals, PA-C  clonazePAM (KLONOPIN) 0.5 MG tablet Take 0.5 mg by mouth 2 (two) times daily as needed. 11/21/21   [provider]  cyclobenzaprine (FLEXERIL) 10 MG tablet Take 1 tablet (10 mg total) by mouth 3 (three) times daily as needed. 08/31/21   Iloabachie, Chioma E, NP  gabapentin (NEURONTIN) 100 MG capsule Take 1 capsule (100 mg total) by mouth 2 (two) times daily. 08/25/21   Iloabachie, Chioma E, NP  HYDROcodone-acetaminophen (NORCO/VICODIN) 5-325 MG tablet Take 1 tablet by mouth every 4 (four) hours as needed. Patient not taking: Reported on 01/15/2023 11/13/22   Minna Antis, MD  ipratropium (ATROVENT) 0.06 % nasal spray Place 2 sprays into both nostrils 4 (four) times daily. 01/08/23   Becky Augusta, NP  lidocaine (XYLOCAINE) 2 % solution Use as directed 10 mLs in the mouth or throat every 4 (four) hours as needed for mouth pain. Gargle and spit 10/05/21   Cuthriell, Delorise Royals, PA-C  Menthol, Topical Analgesic, (BIOFREEZE) 4 % GEL Apply topically. As needed for back pain    [provider]  oxyCODONE-acetaminophen (PERCOCET/ROXICET) 5-325 MG tablet Take 1 tablet by mouth every 6 (  six) hours as needed for severe pain. Patient not taking: Reported on 01/15/2023 02/12/22   Becky Augusta, NP  predniSONE (STERAPRED UNI-PAK 21 TAB) 10 MG (21) TBPK tablet Take by mouth daily. Take 6 tabs by mouth daily for 1, then 5 tabs for 1 day, then 4 tabs for 1 day, then 3 tabs for 1 day, then 2 tabs for 1 day, then 1 tab for 1 day. Patient not taking: Reported on 01/15/2023 08/02/22   Katha Cabal, DO  promethazine-dextromethorphan (PROMETHAZINE-DM) 6.25-15 MG/5ML syrup Take 5 mLs by mouth 4 (four) times daily as needed. 01/08/23   Becky Augusta, NP  QUEtiapine (SEROQUEL) 100 MG tablet Take 100 mg by mouth at bedtime.    [provider]  sertraline (ZOLOFT) 50 MG tablet Take 50 mg by mouth daily. 12/18/21   [provider]    Family History Family History  Problem Relation Age of Onset   Hyperlipidemia Mother    Hypertension Mother    Diabetes Mother        pre-diabetes   Other Father        accidental electrocution   Hypertension Brother    Diabetes Brother    Heart attack Maternal Grandmother    Other Maternal Grandfather        unknown medical history   Diabetes Paternal Grandmother    Heart attack Paternal Grandfather     Social History Social History   Tobacco Use   Smoking status: Every Day    Current packs/day: 1.00    Average packs/day: 1 pack/day for 24.0 years (24.0 ttl pk-yrs)    Types: Cigarettes   Smokeless tobacco: Never  Vaping Use   Vaping status: Never Used  Substance Use Topics   Alcohol use: Yes    Comment: occ   Drug use: No     Allergies   Patient has no known allergies.   Review of Systems Review of Systems  Musculoskeletal:  Positive for back pain.     Physical Exam Triage Vital Signs ED Triage Vitals  Encounter Vitals Group     BP 01/15/23 1232 134/85     Systolic BP Percentile --      Diastolic BP Percentile --      Pulse Rate 01/15/23 1232 99     Resp 01/15/23 1232 (!) 22     Temp 01/15/23 1232 (!) 97.5 F (36.4 C)     Temp Source 01/15/23 1232 Oral     SpO2 01/15/23 1232 99 %     Weight --      Height --      Head Circumference --      Peak Flow --      Pain Score 01/15/23 1229 10     Pain Loc --      Pain Education --      Exclude from Growth Chart --    No data found.  Updated Vital Signs BP 134/85 (BP Location: Left Arm)   Pulse 99   Temp (!) 97.5 F (36.4 C) (Oral)   Resp (!) 22   LMP 01/11/2023   SpO2 99%   Visual Acuity Right Eye Distance:   Left Eye Distance:   Bilateral Distance:    Right Eye Near:   Left Eye Near:    Bilateral Near:     Physical Exam Vitals and nursing note reviewed.   Constitutional:      General: She is not in acute distress.    Appearance: Normal appearance. She  is not ill-appearing.  HENT:     Head: Normocephalic and atraumatic.  Eyes:     Pupils: Pupils are equal, round, and reactive to light.  Cardiovascular:     Rate and Rhythm: Normal rate.  Pulmonary:     Effort: Pulmonary effort is normal.  Musculoskeletal:     Thoracic back: Normal.     Lumbar back: Spasms and tenderness present. No swelling, edema, deformity, signs of trauma, lacerations or bony tenderness. Normal range of motion. Positive right straight leg raise test. Negative left straight leg raise test. No scoliosis.       Back:     Comments: Strength of her lower extremities.  Skin:    General: Skin is warm and dry.  Neurological:     General: No focal deficit present.     Mental Status: She is alert and oriented to person, place, and time.  Psychiatric:        Mood and Affect: Mood normal.        Behavior: Behavior normal.      UC Treatments / Results  Labs (all labs ordered are listed, but only abnormal results are displayed) Labs Reviewed - No data to display   Basic Metabolic Panel Order: [id number] Component Ref Range & Units 4 mo ago  Sodium 135 - 145 mmol/L 140  Potassium 3.4 - 4.8 mmol/L 3.3 Low   Chloride 98 - 107 mmol/L 112 High   CO2 20.0 - 31.0 mmol/L 22.0  Anion Gap 5 - 14 mmol/L 6  BUN 9 - 23 mg/dL <5 Low   Creatinine 4.69 - 1.02 mg/dL 6.29  eGFR CKD-EPI (5284) Female >=60 mL/min/1.47m2 >90  Comment: eGFR calculated with CKD-EPI 2021 equation in accordance with [employer] and [employer] of Nephrology Task Force recommendations.  Glucose 70 - 179 mg/dL 99  Calcium 8.7 - 13.2 mg/dL 9.1  Resulting Agency [facility]   Specimen Collected: 09/07/22 08:17   Performed by: [facility] Last Resulted: 09/07/22 08:51  Received From: [facility]  Result Received: 10/24/22 13:54     EKG   Radiology No results found.  Procedures Procedures (including critical care time)  Medications Ordered in UC Medications  ketorolac (TORADOL) 30 MG/ML injection 30 mg (has no administration in time range)    Initial Impression / Assessment and Plan / UC Course  I have reviewed the triage vital signs and the nursing notes.  Pertinent labs & imaging results that were available during my care of the patient were reviewed by me and considered in my medical decision making (see chart for details).     Reviewed exam and symptoms with patient.  History of chronic low back pain with acute flare.  Discussed treatment options available in clinic.  Patient asking for something strong.  she is agreeable to Toradol injection which was given.  She was monitored for 10 minutes after injection with no reaction noted and she tolerated well.  Instructed no NSAIDs for 24 hours and she verbalized understanding.  She declined trial of a different muscle relaxer.  She can continue tizanidine and her gabapentin as needed.  Discussed heat to the area.  Advise she see either her PCP or her spine specialist in 2 days for recheck.  Strict ER precautions reviewed and patient verbalized understanding. Final Clinical Impressions(s) / UC Diagnoses   Final diagnoses:  Chronic right-sided low back pain with right-sided sciatica   Discharge Instructions   None    ED Prescriptions  None    PDMP not reviewed this encounter.   Radford Pax, NP 01/15/23 1253    Radford Pax, NP 01/15/23 1254

## 2023-01-15 NOTE — Discharge Instructions (Signed)
You were given a Toradol injection in clinic today. Do not take any over the counter NSAID's such as Advil, ibuprofen, Aleve, or naproxen for 24 hours.  You may take tylenol if needed You could continue your tizanidine and gabapentin as prescribed.  Heat to the back as needed.  I advise you follow-up with your PCP or your spine specialist in 2 days for recheck.  Please go to the ER if you develop any worsening symptoms.  I hope you feel better soon!

## 2023-03-31 ENCOUNTER — Emergency Department: Payer: Medicaid Other

## 2023-03-31 ENCOUNTER — Other Ambulatory Visit: Payer: Self-pay

## 2023-03-31 ENCOUNTER — Emergency Department
Admission: EM | Admit: 2023-03-31 | Discharge: 2023-03-31 | Disposition: A | Discharge status: Home / Self Care | Payer: Medicaid Other | Attending: Emergency Medicine | Admitting: Emergency Medicine

## 2023-03-31 DIAGNOSIS — J45909 Unspecified asthma, uncomplicated: Secondary | ICD-10-CM | POA: Diagnosis not present

## 2023-03-31 DIAGNOSIS — N938 Other specified abnormal uterine and vaginal bleeding: Secondary | ICD-10-CM | POA: Insufficient documentation

## 2023-03-31 DIAGNOSIS — N939 Abnormal uterine and vaginal bleeding, unspecified: Secondary | ICD-10-CM | POA: Diagnosis present

## 2023-03-31 LAB — CBC
HCT: 39.9 % (ref 36.0–46.0)
Hemoglobin: 13.1 g/dL (ref 12.0–15.0)
MCH: 28.7 pg (ref 26.0–34.0)
MCHC: 32.8 g/dL (ref 30.0–36.0)
MCV: 87.5 fL (ref 80.0–100.0)
Platelets: 336 10*3/uL (ref 150–400)
RBC: 4.56 MIL/uL (ref 3.87–5.11)
RDW: 15.9 % — ABNORMAL HIGH (ref 11.5–15.5)
WBC: 7.9 10*3/uL (ref 4.0–10.5)
nRBC: 0 % (ref 0.0–0.2)

## 2023-03-31 LAB — URINALYSIS, ROUTINE W REFLEX MICROSCOPIC
Bilirubin Urine: NEGATIVE
Glucose, UA: NEGATIVE mg/dL
Ketones, ur: NEGATIVE mg/dL
Leukocytes,Ua: NEGATIVE
Nitrite: NEGATIVE
Protein, ur: NEGATIVE mg/dL
RBC / HPF: >50 RBC/hpf (ref 0–5)
Specific Gravity, Urine: 1.011 (ref 1.005–1.030)
pH: 5 (ref 5.0–8.0)

## 2023-03-31 LAB — BASIC METABOLIC PANEL
Anion gap: 7 (ref 5–15)
BUN: 8 mg/dL (ref 6–20)
CO2: 24 mmol/L (ref 22–32)
Calcium: 8.4 mg/dL — ABNORMAL LOW (ref 8.9–10.3)
Chloride: 102 mmol/L (ref 98–111)
Creatinine, Ser: 0.8 mg/dL (ref 0.44–1.00)
GFR, Estimated: >60 mL/min (ref >60)
Glucose, Bld: 119 mg/dL — ABNORMAL HIGH (ref 70–99)
Potassium: 4.2 mmol/L (ref 3.5–5.1)
Sodium: 133 mmol/L — ABNORMAL LOW (ref 135–145)

## 2023-03-31 LAB — POC URINE PREG, ED: Preg Test, Ur: NEGATIVE

## 2023-03-31 MED ORDER — OXYCODONE-ACETAMINOPHEN 5-325 MG PO TABS
1.0000 | ORAL_TABLET | ORAL | 0 refills | Status: DC | PRN
Start: 2023-03-31 — End: 2023-06-01

## 2023-03-31 MED ORDER — MEDROXYPROGESTERONE ACETATE 5 MG PO TABS
5.0000 mg | ORAL_TABLET | Freq: Every day | ORAL | 0 refills | Status: AC
Start: 2023-03-31 — End: 2024-03-30

## 2023-03-31 MED ORDER — OXYCODONE-ACETAMINOPHEN 5-325 MG PO TABS
1.0000 | ORAL_TABLET | Freq: Once | ORAL | Status: AC
  Administered 2023-03-31: 1 via ORAL
  Filled 2023-03-31: qty 1

## 2023-03-31 NOTE — ED Provider Notes (Signed)
[facility] Provider Note    Event Date/Time   First MD Initiated Contact with Patient 03/31/23 1427     (approximate)   History   Vaginal Bleeding   HPI  Donna Howard is a 52 y.o. female history of asthma, back pain and anxiety presents emergency department with complaints of heavy vaginal bleeding.  She states been ongoing for 3 weeks.  Started have some lower abdominal pain due to the amount of bleeding.  Been going through pads and tampons where she is bleeding through to her close.  Talk to her regular doctor about it and she did not give her any medication.  No fever or chills.  Patient does not have a GYN      Physical Exam   Triage Vital Signs: ED Triage Vitals  Encounter Vitals Group     BP 03/31/23 1313 (!) 148/97     Systolic BP Percentile --      Diastolic BP Percentile --      Pulse Rate 03/31/23 1313 81     Resp 03/31/23 1313 16     Temp 03/31/23 1313 97.9 F (36.6 C)     Temp Source 03/31/23 1313 Oral     SpO2 03/31/23 1313 96 %     Weight 03/31/23 1315 140 lb (63.5 kg)     Height 03/31/23 1315 5\' 7"  (1.702 m)     Head Circumference --      Peak Flow --      Pain Score 03/31/23 1311 3     Pain Loc --      Pain Education --      Exclude from Growth Chart --     Most recent vital signs: Vitals:   03/31/23 1313  BP: (!) 148/97  Pulse: 81  Resp: 16  Temp: 97.9 F (36.6 C)  SpO2: 96%     General: Awake, no distress.   CV:  Good peripheral perfusion. regular rate and  rhythm Resp:  Normal effort.  Abd:  No distention.  nontender Other:     ED Results / Procedures / Treatments   Labs (all labs ordered are listed, but only abnormal results are displayed) Labs Reviewed  CBC - Abnormal; Notable for the following components:      Result Value   RDW 15.9 (*)    All other components within normal limits  BASIC METABOLIC PANEL - Abnormal; Notable for the following components:   Sodium 133 (*)    Glucose, Bld 119 (*)     Calcium 8.4 (*)    All other components within normal limits  URINALYSIS, ROUTINE W REFLEX MICROSCOPIC - Abnormal; Notable for the following components:   Color, Urine YELLOW (*)    APPearance HAZY (*)    Hgb urine dipstick LARGE (*)    Bacteria, UA FEW (*)    All other components within normal limits  POC URINE PREG, ED     EKG     RADIOLOGY Ultrasound of the pelvis    PROCEDURES:   Procedures   MEDICATIONS ORDERED IN ED: Medications  oxyCODONE-acetaminophen (PERCOCET/ROXICET) 5-325 MG per tablet 1 tablet (1 tablet Oral Given 03/31/23 1600)     IMPRESSION / MDM / ASSESSMENT AND PLAN / ED COURSE  I reviewed the triage vital signs and the nursing notes.  Differential diagnosis includes, but is not limited to, fibroids, menorrhagia, bridge  Patient's presentation is most consistent with acute illness / injury with system symptoms.   Patient's blood cell counts are stable  Ultrasound independently reviewed interpreted by me by reading radiologist report as being negative for any acute abnormality.  Did explain everything to the patient.  She was given a Percocet while here in the ED.  Prescription for Provera.  Feel that she needs follow-up with GYN.  Explained to her that we can see everything on the outside with an ultrasound but cannot tell what is going on inside the uterus.  States options for her when she talks to the GYN doctor could be something as simple as birth control, uterine ablation, or hysterectomy.  Patient states she understands.  She is discharged stable condition.      FINAL CLINICAL IMPRESSION(S) / ED DIAGNOSES   Final diagnoses:  Dysfunctional uterine bleeding     Rx / DC Orders   ED Discharge Orders          Ordered    oxyCODONE-acetaminophen (PERCOCET) 5-325 MG tablet  Every 4 hours PRN        03/31/23 1557    medroxyPROGESTERone (PROVERA) 5 MG tablet  Daily        03/31/23 1601              Note:  This document was prepared using Dragon voice recognition software and may include unintentional dictation errors.    Faythe Ghee, PA-C 03/31/23 1607    Janith Lima, MD 03/31/23 [phone]

## 2023-03-31 NOTE — ED Triage Notes (Signed)
Pt to ED for vaginal bleeding with flat golf ball size clots since 3 weeks. Saw PCP 1 week ago. States also having pain to pelvic area, bilateral. Ultrasound is scheduled for Thursday. Has been taking "advil and ibuprophen and BCs like crazy". States PCP should have checked urine and did pap smear but did not. Pt is ambulatory with steady gait.  Pt still gets periods. States PCP had told her she may have endometriosis.

## 2023-03-31 NOTE — Discharge Instructions (Addendum)
Follow-up with your regular doctor.  Follow-up with Dr. Oretha Milch office please call for an appointment

## 2023-04-23 ENCOUNTER — Ambulatory Visit
Admission: EM | Admit: 2023-04-23 | Discharge: 2023-04-23 | Disposition: A | Discharge status: Home / Self Care | Payer: Medicaid Other | Attending: Internal Medicine | Admitting: Internal Medicine

## 2023-04-23 ENCOUNTER — Encounter: Payer: Self-pay | Admitting: Emergency Medicine

## 2023-04-23 DIAGNOSIS — G8929 Other chronic pain: Secondary | ICD-10-CM | POA: Diagnosis not present

## 2023-04-23 DIAGNOSIS — M545 Low back pain, unspecified: Secondary | ICD-10-CM

## 2023-04-23 MED ORDER — KETOROLAC TROMETHAMINE 30 MG/ML IJ SOLN
30.0000 mg | Freq: Once | INTRAMUSCULAR | Status: AC
  Administered 2023-04-23: 30 mg via INTRAMUSCULAR

## 2023-04-23 MED ORDER — CYCLOBENZAPRINE HCL 10 MG PO TABS
10.0000 mg | ORAL_TABLET | Freq: Two times a day (BID) | ORAL | 0 refills | Status: AC | PRN
Start: 2023-04-23 — End: ?

## 2023-04-23 NOTE — ED Triage Notes (Signed)
Pt presents with chronic right side back pain that started hurting 3 days ago. The pain is radiating down to her butt. She reports taking Advil today with no relief.

## 2023-04-23 NOTE — ED Provider Notes (Signed)
[facility]    CSN: [id number] Arrival date & time: 04/23/23  1601      History   Chief Complaint Chief Complaint  Patient presents with   Back Pain    HPI Donna Howard is a 52 y.o. female presents for back pain.  Patient has a history of chronic right lower back pain and states she recently had an MRI showing a slipped disc in her lower back.  Reports she has had cortisone injections without improvement and has discussed surgery but due to work concerns she is not sure if this is something she wants to follow through with.  She states about 3 days of right lower back pain that radiates into her buttock.  Denies any known injury or inciting event.  No numbness/tingling/weakness of her lower extremities, no bowel or bladder incontinence, no saddle paresthesia.  She has been taking Advil and Tylenol with minimal improvement.  She also has tizanidine and gabapentin that she has been taking.  She was last seen in August for same complaint and reports improvement after Toradol injection.  She also reports that she would like to try Flexeril again as she feels the tizanidine is not effective.  No other concerns at this time.  Back Pain   Past Medical History:  Diagnosis Date   Anxiety    Asthma    Asthma    Back pain     Patient Active Problem List   Diagnosis Date Noted   Fall 10/24/2022   Acute right ankle pain 10/24/2022   Musculoskeletal pain 10/24/2022   Encounter to establish care 08/10/2021   History of asthma 08/10/2021   Chronic lower back pain 08/10/2021   Anxiety 08/10/2021   Smoking 08/10/2021    Past Surgical History:  Procedure Laterality Date   TUBAL LIGATION      OB History   No obstetric history on file.      Home Medications    Prior to Admission medications   Medication Sig Start Date End Date Taking? Authorizing Provider  cyclobenzaprine (FLEXERIL) 10 MG tablet Take 1 tablet (10 mg total) by mouth 2 (two) times daily as needed for  muscle spasms. 04/23/23  Yes Radford Pax, NP  albuterol (PROVENTIL HFA) 108 (90 Base) MCG/ACT inhaler Inhale 2 puffs into the lungs once every 6 (six) hours as needed for wheezing or shortness of breath. 08/10/21   Iloabachie, Chioma E, NP  albuterol (VENTOLIN HFA) 108 (90 Base) MCG/ACT inhaler Inhale 1-2 puffs into the lungs every 6 (six) hours as needed for wheezing or shortness of breath. 10/20/21   Shirlee Latch, PA-C  cetirizine (ZYRTEC) 10 MG tablet Take 1 tablet (10 mg total) by mouth daily. 08/02/15   Cuthriell, Delorise Royals, PA-C  clonazePAM (KLONOPIN) 0.5 MG tablet Take 0.5 mg by mouth 2 (two) times daily as needed. 11/21/21   [provider]  gabapentin (NEURONTIN) 100 MG capsule Take 1 capsule (100 mg total) by mouth 2 (two) times daily. 08/25/21   Iloabachie, Chioma E, NP  ipratropium (ATROVENT) 0.06 % nasal spray Place 2 sprays into both nostrils 4 (four) times daily. 01/08/23   Becky Augusta, NP  lidocaine (XYLOCAINE) 2 % solution Use as directed 10 mLs in the mouth or throat every 4 (four) hours as needed for mouth pain. Gargle and spit 10/05/21   Cuthriell, Delorise Royals, PA-C  medroxyPROGESTERone (PROVERA) 5 MG tablet Take 1 tablet (5 mg total) by mouth daily. 03/31/23 03/30/24  Faythe Ghee,  PA-C  Menthol, Topical Analgesic, (BIOFREEZE) 4 % GEL Apply topically. As needed for back pain    [provider]  oxyCODONE-acetaminophen (PERCOCET) 5-325 MG tablet Take 1 tablet by mouth every 4 (four) hours as needed for severe pain (pain score 7-10). 03/31/23 03/30/24  Fisher, Roselyn Bering, PA-C  predniSONE (STERAPRED UNI-PAK 21 TAB) 10 MG (21) TBPK tablet Take by mouth daily. Take 6 tabs by mouth daily for 1, then 5 tabs for 1 day, then 4 tabs for 1 day, then 3 tabs for 1 day, then 2 tabs for 1 day, then 1 tab for 1 day. Patient not taking: Reported on 01/15/2023 08/02/22   Katha Cabal, DO  QUEtiapine (SEROQUEL) 100 MG tablet Take 100 mg by mouth at bedtime.    [provider]   sertraline (ZOLOFT) 50 MG tablet Take 50 mg by mouth daily. 12/18/21   [provider]    Family History Family History  Problem Relation Age of Onset   Hyperlipidemia Mother    Hypertension Mother    Diabetes Mother        pre-diabetes   Other Father        accidental electrocution   Hypertension Brother    Diabetes Brother    Heart attack Maternal Grandmother    Other Maternal Grandfather        unknown medical history   Diabetes Paternal Grandmother    Heart attack Paternal Grandfather     Social History Social History   Tobacco Use   Smoking status: Every Day    Current packs/day: 1.00    Average packs/day: 1 pack/day for 24.0 years (24.0 ttl pk-yrs)    Types: Cigarettes   Smokeless tobacco: Never  Vaping Use   Vaping status: Never Used  Substance Use Topics   Alcohol use: Yes    Comment: occ   Drug use: No     Allergies   Patient has no known allergies.   Review of Systems Review of Systems  Musculoskeletal:  Positive for back pain.     Physical Exam Triage Vital Signs ED Triage Vitals  Encounter Vitals Group     BP 04/23/23 1633 100/75     Systolic BP Percentile --      Diastolic BP Percentile --      Pulse Rate 04/23/23 1632 86     Resp 04/23/23 1632 18     Temp 04/23/23 1632 98.2 F (36.8 C)     Temp Source 04/23/23 1632 Oral     SpO2 04/23/23 1632 97 %     Weight --      Height --      Head Circumference --      Peak Flow --      Pain Score 04/23/23 1632 8     Pain Loc --      Pain Education --      Exclude from Growth Chart --    No data found.  Updated Vital Signs BP 100/75 (BP Location: Right Arm)   Pulse 86   Temp 98.2 F (36.8 C) (Oral)   Resp 18   LMP 04/09/2023 (Approximate)   SpO2 97%   Visual Acuity Right Eye Distance:   Left Eye Distance:   Bilateral Distance:    Right Eye Near:   Left Eye Near:    Bilateral Near:     Physical Exam Vitals and nursing note reviewed.  Constitutional:      General:  She is not in acute distress.  Appearance: Normal appearance. She is not ill-appearing.  HENT:     Head: Normocephalic and atraumatic.  Eyes:     Pupils: Pupils are equal, round, and reactive to light.  Cardiovascular:     Rate and Rhythm: Normal rate.  Pulmonary:     Effort: Pulmonary effort is normal.  Musculoskeletal:     Lumbar back: Tenderness present. No swelling, edema, deformity, signs of trauma, lacerations, spasms or bony tenderness. Normal range of motion. Negative right straight leg raise test and negative left straight leg raise test. No scoliosis.       Back:     Comments: Strength 5 out of 5 bilateral lower extremities  Skin:    General: Skin is warm and dry.  Neurological:     General: No focal deficit present.     Mental Status: She is alert and oriented to person, place, and time.  Psychiatric:        Mood and Affect: Mood normal.        Behavior: Behavior normal.      UC Treatments / Results  Labs (all labs ordered are listed, but only abnormal results are displayed) Labs Reviewed - No data to display  EKG   Radiology No results found.  Procedures Procedures (including critical care time)  Medications Ordered in UC Medications  ketorolac (TORADOL) 30 MG/ML injection 30 mg (30 mg Intramuscular Given 04/23/23 1719)    Initial Impression / Assessment and Plan / UC Course  I have reviewed the triage vital signs and the nursing notes.  Pertinent labs & imaging results that were available during my care of the patient were reviewed by me and considered in my medical decision making (see chart for details).      Reviewed exam and symptoms with patient.  No red flags.  Patient presenting with acute on chronic right lower back pain.  Patient given Toradol injection in clinic.  Monitored for 10 minutes after injection with no reaction noted and tolerated well.  Was instructed no NSAIDs for 24 hours and verbalized understanding.  Will do trial of  Flexeril as patient reports this has benefited her in the past.  She is to stop tizanidine.  Continue heat to the back and rest.  Patient follow-up with orthopedics for further treatment options.  ER precautions reviewed. Final Clinical Impressions(s) / UC Diagnoses   Final diagnoses:  Chronic right-sided low back pain without sciatica     Discharge Instructions      You were given a Toradol injection in clinic today. Do not take any over the counter NSAID's such as Advil, ibuprofen, Aleve, or naproxen for 24 hours. You may take tylenol if needed.  Stop tizanidine and you may take Flexeril twice daily as needed.  Please of this medication will make you drowsy.  Do not drink alcohol or drive on this medication.  Heat and rest to the back as needed.  Please follow-up with orthopedics for further treatment options of your chronic low back pain.  Please go to the ER if you develop any worsening symptoms.  Hope you feel better soon!      ED Prescriptions     Medication Sig Dispense Auth. Provider   cyclobenzaprine (FLEXERIL) 10 MG tablet Take 1 tablet (10 mg total) by mouth 2 (two) times daily as needed for muscle spasms. 10 tablet Radford Pax, NP      PDMP not reviewed this encounter.   Radford Pax, NP 04/23/23 [phone]

## 2023-04-23 NOTE — Discharge Instructions (Signed)
You were given a Toradol injection in clinic today. Do not take any over the counter NSAID's such as Advil, ibuprofen, Aleve, or naproxen for 24 hours. You may take tylenol if needed.  Stop tizanidine and you may take Flexeril twice daily as needed.  Please of this medication will make you drowsy.  Do not drink alcohol or drive on this medication.  Heat and rest to the back as needed.  Please follow-up with orthopedics for further treatment options of your chronic low back pain.  Please go to the ER if you develop any worsening symptoms.  Hope you feel better soon!

## 2023-06-01 ENCOUNTER — Ambulatory Visit
Admission: EM | Admit: 2023-06-01 | Discharge: 2023-06-01 | Disposition: A | Discharge status: Home / Self Care | Payer: Medicaid Other | Attending: Family Medicine | Admitting: Family Medicine

## 2023-06-01 ENCOUNTER — Encounter: Payer: Self-pay | Admitting: Emergency Medicine

## 2023-06-01 DIAGNOSIS — M545 Low back pain, unspecified: Secondary | ICD-10-CM

## 2023-06-01 DIAGNOSIS — G8929 Other chronic pain: Secondary | ICD-10-CM | POA: Diagnosis not present

## 2023-06-01 MED ORDER — KETOROLAC TROMETHAMINE 60 MG/2ML IM SOLN
30.0000 mg | Freq: Once | INTRAMUSCULAR | Status: AC
  Administered 2023-06-01: 30 mg via INTRAMUSCULAR

## 2023-06-01 MED ORDER — HYDROCODONE-ACETAMINOPHEN 5-325 MG PO TABS: 1.0000 | ORAL_TABLET | Freq: Four times a day (QID) | ORAL | 0 refills | Status: DC | PRN

## 2023-06-01 NOTE — Discharge Instructions (Addendum)
 We will not be prescribing any opioids here in the future for your chronic pain.  Follow-up with a pain clinic as scheduled.  Your primary care doctor may prescribe these medications for you in the future.  Follow up with your orthopedic provider as you may need to have additional imaging if your pain is not improving.

## 2023-06-01 NOTE — ED Provider Notes (Signed)
 [facility]    CSN: [id number] Arrival date & time: 06/01/23  1235      History   Chief Complaint Chief Complaint  Patient presents with   Back Pain    HPI  HPI Donna Howard is a 53 y.o. female.   Donna Howard presents for low back pain that started 3 days ago. Has a slipped disc on her right side.  Says they wanted her to have surgery but she didn't want to do that.  Taking Gabapentin , ibuprofen and muscle relaxer. Has been using a heated blanket. The pain is walking her up from sleep. 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  Has an appointment with the pain clinic soon.  No perianal numbness, cancer, unexplained weight loss, immunosuppression.    Past Medical History:  Diagnosis Date   Anxiety    Asthma    Asthma    Back pain     Patient Active Problem List   Diagnosis Date Noted   Fall 10/24/2022   Acute right ankle pain 10/24/2022   Musculoskeletal pain 10/24/2022   Encounter to establish care 08/10/2021   History of asthma 08/10/2021   Chronic lower back pain 08/10/2021   Anxiety 08/10/2021   Smoking 08/10/2021    Past Surgical History:  Procedure Laterality Date   TUBAL LIGATION      OB History   No obstetric history on file.      Home Medications    Prior to Admission medications   Medication Sig Start Date End Date Taking? Authorizing Provider  HYDROcodone -acetaminophen  (NORCO/VICODIN) 5-325 MG tablet Take 1 tablet by mouth every 6 (six) hours as needed. 06/01/23  Yes Cem Kosman, DO  albuterol  (PROVENTIL  HFA) 108 (90 Base) MCG/ACT inhaler Inhale 2 puffs into the lungs once every 6 (six) hours as needed for wheezing or shortness of breath. 08/10/21   Iloabachie, Chioma E, NP  albuterol  (VENTOLIN  HFA) 108 (90 Base) MCG/ACT inhaler Inhale 1-2 puffs into the lungs every 6 (six) hours as needed for wheezing or shortness of breath. 10/20/21   Arvis Jolan NOVAK, PA-C  cetirizine  (ZYRTEC ) 10 MG tablet Take 1 tablet (10 mg total) by mouth daily. 08/02/15    Cuthriell, Dorn BIRCH, PA-C  clonazePAM (KLONOPIN) 0.5 MG tablet Take 0.5 mg by mouth 2 (two) times daily as needed. 11/21/21   [provider]  cyclobenzaprine  (FLEXERIL ) 10 MG tablet Take 1 tablet (10 mg total) by mouth 2 (two) times daily as needed for muscle spasms. 04/23/23   Mayer, Jodi R, NP  gabapentin  (NEURONTIN ) 100 MG capsule Take 1 capsule (100 mg total) by mouth 2 (two) times daily. 08/25/21   Iloabachie, Chioma E, NP  ipratropium (ATROVENT ) 0.06 % nasal spray Place 2 sprays into both nostrils 4 (four) times daily. 01/08/23   Bernardino Ditch, NP  lidocaine  (XYLOCAINE ) 2 % solution Use as directed 10 mLs in the mouth or throat every 4 (four) hours as needed for mouth pain. Gargle and spit 10/05/21   Cuthriell, Dorn D, PA-C  medroxyPROGESTERone  (PROVERA ) 5 MG tablet Take 1 tablet (5 mg total) by mouth daily. 03/31/23 03/30/24  Fisher, Devere ORN, PA-C  Menthol, Topical Analgesic, (BIOFREEZE) 4 % GEL Apply topically. As needed for back pain    [provider]  QUEtiapine (SEROQUEL) 100 MG tablet Take 100 mg by mouth at bedtime.    [provider]  sertraline (ZOLOFT) 50 MG tablet Take 50 mg by mouth daily. 12/18/21   [provider]    Family  History Family History  Problem Relation Age of Onset   Hyperlipidemia Mother    Hypertension Mother    Diabetes Mother        pre-diabetes   Other Father        accidental electrocution   Hypertension Brother    Diabetes Brother    Heart attack Maternal Grandmother    Other Maternal Grandfather        unknown medical history   Diabetes Paternal Grandmother    Heart attack Paternal Grandfather     Social History Social History   Tobacco Use   Smoking status: Every Day    Current packs/day: 1.00    Average packs/day: 1 pack/day for 24.0 years (24.0 ttl pk-yrs)    Types: Cigarettes   Smokeless tobacco: Never  Vaping Use   Vaping status: Never Used  Substance Use Topics   Alcohol use: Yes    Comment: occ    Drug use: No     Allergies   Patient has no known allergies.   Review of Systems Review of Systems: egative unless otherwise stated in HPI.      Physical Exam Triage Vital Signs ED Triage Vitals  Encounter Vitals Group     BP 06/01/23 1255 (!) 127/92     Systolic BP Percentile --      Diastolic BP Percentile --      Pulse Rate 06/01/23 1255 90     Resp 06/01/23 1255 15     Temp 06/01/23 1255 97.7 F (36.5 C)     Temp Source 06/01/23 1255 Oral     SpO2 06/01/23 1255 95 %     Weight 06/01/23 1254 139 lb 15.9 oz (63.5 kg)     Height 06/01/23 1254 5' 7 (1.702 m)     Head Circumference --      Peak Flow --      Pain Score 06/01/23 1254 9     Pain Loc --      Pain Education --      Exclude from Growth Chart --    No data found.  Updated Vital Signs BP (!) 127/92 (BP Location: Right Arm)   Pulse 90   Temp 97.7 F (36.5 C) (Oral)   Resp 15   Ht 5' 7 (1.702 m)   Wt 63.5 kg   LMP 05/18/2023 (Approximate)   SpO2 95%   BMI 21.93 kg/m   Visual Acuity Right Eye Distance:   Left Eye Distance:   Bilateral Distance:    Right Eye Near:   Left Eye Near:    Bilateral Near:     Physical Exam GEN: well appearing female tearful  HENT: Missing multiple teeth in her mouth CVS: well perfused  RESP: speaking in full sentences without pause, no respiratory distress  MSK:  Lumbar spine: - Inspection: no gross deformity or asymmetry, swelling or ecchymosis. No skin changes  - Palpation: TTP over the entire lumbar spinous processes, right lumbar paraspinal muscle tenderness and hypertonicity, no SI joint tenderness bilaterally - ROM: good active ROM of the lumbar spine in flexion and extension but with pain  - Strength: 5/5 strength of lower extremity in L4-S1 nerve root distributions b/l - Neuro: sensation intact in the L4-S1 nerve root distribution b/l - Special testing: Negative straight leg raise SKIN: warm, dry, no overly skin rash or erythema    UC Treatments /  Results  Labs (all labs ordered are listed, but only abnormal results are displayed) Labs Reviewed - No  data to display  EKG   Radiology No results found.   Procedures Procedures (including critical care time)  Medications Ordered in UC Medications  ketorolac  (TORADOL ) injection 30 mg (30 mg Intramuscular Given 06/01/23 1357)    Initial Impression / Assessment and Plan / UC Course  I have reviewed the triage vital signs and the nursing notes.  Pertinent labs & imaging results that were available during my care of the patient were reviewed by me and considered in my medical decision making (see chart for details).      Pt is a 53 y.o.  female with acute on chronic low back pain with right paraspinal pain.  Has history of chronic low back pain.   Imaging deferred today.  Had lumbar xrays that showed mild right convex lumbar scoliotic curvature with age advanced facet degenerative changes in the lower lumbar spine.   Patient to gradually return to normal activities, as tolerated and continue ordinary activities within the limits permitted by pain. Prescribed short course of Norco  for pain relief.  Continue ibuprofen, gabapentin  and her muscle relaxers.  Tylenol  and Lidocaine  patches PRN for multimodal pain relief.  Stressed the importance of her keeping her pain management appointment.  Explained and documented on AVS that she should not to get any further opioids for her chronic pain here.  Voiced understanding. No red flags suggesting cauda equina syndrome or progressive major motor weakness.  Advised speaking with her manager about seeing occupational health as she states she works for [employer] and has to lift heavy boxes.  Client to work note.  Patient to follow up with pain management and her orthopedic provider.  Return and ED precautions given. Discussed MDM, treatment plan and plan for follow-up with patient who agrees with plan.   Final Clinical Impressions(s) / UC Diagnoses    Final diagnoses:  Chronic bilateral low back pain without sciatica     Discharge Instructions      We will not be prescribing any opioids here in the future for your chronic pain.  Follow-up with a pain clinic as scheduled.  Your primary care doctor may prescribe these medications for you in the future.  Follow up with your orthopedic provider as you may need to have additional imaging if your pain is not improving.        ED Prescriptions     Medication Sig Dispense Auth. Provider   HYDROcodone -acetaminophen  (NORCO/VICODIN) 5-325 MG tablet Take 1 tablet by mouth every 6 (six) hours as needed. 12 tablet Natelie Ostrosky, DO      I have reviewed the PDMP during this encounter.   Kriste Berth, DO 06/01/23 1409

## 2023-06-01 NOTE — ED Triage Notes (Signed)
 Patient c/o lower back pain that started 3 days ago.  Patient states that she has a slipped disc.  Patient states that she was seen last month for the same pain.

## 2023-06-10 ENCOUNTER — Other Ambulatory Visit: Payer: Self-pay

## 2023-06-10 ENCOUNTER — Emergency Department
Admission: EM | Admit: 2023-06-10 | Discharge: 2023-06-10 | Disposition: A | Discharge status: Home / Self Care | Payer: Medicaid Other | Attending: Emergency Medicine | Admitting: Emergency Medicine

## 2023-06-10 DIAGNOSIS — J45909 Unspecified asthma, uncomplicated: Secondary | ICD-10-CM | POA: Insufficient documentation

## 2023-06-10 DIAGNOSIS — G8929 Other chronic pain: Secondary | ICD-10-CM | POA: Diagnosis not present

## 2023-06-10 DIAGNOSIS — M5441 Lumbago with sciatica, right side: Secondary | ICD-10-CM | POA: Insufficient documentation

## 2023-06-10 DIAGNOSIS — F17200 Nicotine dependence, unspecified, uncomplicated: Secondary | ICD-10-CM | POA: Diagnosis not present

## 2023-06-10 DIAGNOSIS — M545 Low back pain, unspecified: Secondary | ICD-10-CM | POA: Diagnosis present

## 2023-06-10 MED ORDER — HYDROCODONE-ACETAMINOPHEN 5-325 MG PO TABS: 1.0000 | ORAL_TABLET | Freq: Three times a day (TID) | ORAL | 0 refills | Status: AC

## 2023-06-10 MED ORDER — HYDROCODONE-ACETAMINOPHEN 5-325 MG PO TABS
1.0000 | ORAL_TABLET | Freq: Once | ORAL | Status: AC
  Administered 2023-06-10: 1 via ORAL
  Filled 2023-06-10: qty 1

## 2023-06-10 MED ORDER — KETOROLAC TROMETHAMINE 30 MG/ML IJ SOLN
30.0000 mg | Freq: Once | INTRAMUSCULAR | Status: AC
  Administered 2023-06-10: 30 mg via INTRAMUSCULAR
  Filled 2023-06-10: qty 1

## 2023-06-10 NOTE — ED Provider Notes (Signed)
[facility] Emergency Department Provider Note     Event Date/Time   First MD Initiated Contact with Patient 06/10/23 2107     (approximate)   History   Back Pain   HPI  Donna Howard is a 53 y.o. female with a history of chronic low back pain, sciatica, asthma, anxiety, smoking, presents to the ED for evaluation of chronic low back pain.  Patient according to chart review and her report, is recent been evaluated by a provider at [facility] who intends to send her for repeat MRI, trial of physical therapy, and referral to spine specially and/or, pain management.  Patient presents noting that she has been taking her previously prescribed gabapentin and ibuprofen with limited benefit.  She denies any recent injury, trauma, or falls.  No bladder or bowel incontinence, foot drop, or saddle anesthesias.  Patient presents to the ED requesting pain relief for her chronic pain.  Physical Exam   Triage Vital Signs: ED Triage Vitals [06/10/23 1845]  Encounter Vitals Group     BP (!) 125/94     Systolic BP Percentile      Diastolic BP Percentile      Pulse Rate 95     Resp 17     Temp 98.1 F (36.7 C)     Temp Source Oral     SpO2 97 %     Weight 139 lb 15.9 oz (63.5 kg)     Height 5\' 7"  (1.702 m)     Head Circumference      Peak Flow      Pain Score 10     Pain Loc      Pain Education      Exclude from Growth Chart     Most recent vital signs: Vitals:   06/10/23 1845  BP: (!) 125/94  Pulse: 95  Resp: 17  Temp: 98.1 F (36.7 C)  SpO2: 97%    General Awake, no distress. NAD HEENT NCAT. PERRL. EOMI. No rhinorrhea. Mucous membranes are moist.  CV:  Good peripheral perfusion. RRR RESP:  Normal effort. CTA ABD:  No distention.  MSK:  Normal spinal midline tenderness, spasm, deformity, or step off.  Active range of motion of the lower extremities bilaterally. NEURO: Cranial nerves II to XII grossly intact.  Normal LE DTRs bilaterally.  Normal toe  dorsiflexion and foot eversion on exam.   ED Results / Procedures / Treatments   Labs (all labs ordered are listed, but only abnormal results are displayed) Labs Reviewed - No data to display   EKG   RADIOLOGY   PROCEDURES:  Critical Care performed: No  Procedures   MEDICATIONS ORDERED IN ED: Medications  ketorolac (TORADOL) 30 MG/ML injection 30 mg (30 mg Intramuscular Given 06/10/23 2233)  HYDROcodone-acetaminophen (NORCO/VICODIN) 5-325 MG per tablet 1 tablet (1 tablet Oral Given 06/10/23 2233)     IMPRESSION / MDM / ASSESSMENT AND PLAN / ED COURSE  I reviewed the triage vital signs and the nursing notes.                              Differential diagnosis includes, but is not limited to, lumbar strain, lumbar radiculopathy, myalgias, DDD  Patient's presentation is most consistent with acute, uncomplicated illness.  Patient's diagnosis is consistent with acute on chronic low back pain with underlying history of DDD and sciatica.  Patient with reassuring exam and workup at this time.  No red flags on exam.  Patient treated in the ED with a IM dose of Toradol and a single dose of Vicodin given p.o.  Patient will be discharged home with prescriptions for hydrocodone (#9) and instruction to continue with her previously prescribed medications. Patient is to follow up with her specialist as scheduled, as needed or otherwise directed. Patient is given ED precautions to return to the ED for any worsening or new symptoms.     FINAL CLINICAL IMPRESSION(S) / ED DIAGNOSES   Final diagnoses:  Chronic right-sided low back pain with right-sided sciatica     Rx / DC Orders   ED Discharge Orders          Ordered    HYDROcodone-acetaminophen (NORCO) 5-325 MG tablet  3 times daily        06/10/23 2222             Note:  This document was prepared using Dragon voice recognition software and may include unintentional dictation errors.    Lissa Hoard,  PA-C 06/11/23 0019    Dionne Bucy, MD 06/13/23 [phone]

## 2023-06-10 NOTE — Discharge Instructions (Addendum)
Your exam is normal and reassuring at this time.  No signs of any serious spinal cord or disc injury.  Take your gabapentin as directed.  Continue with your muscle relaxant up to 3 times daily.  You should follow-up with primary provider for ongoing evaluation.  You should also understand that you may not be provided with narcotic pain medication going forward, for this complaint.

## 2023-06-10 NOTE — ED Triage Notes (Signed)
Pt sts that she has a slipped disk in her back. Pt sts that she has been taking gabapentin and ibuprofen while at home. Pt sts that she was a [facility] due to the pain. Pt pcp told her two days ago that the pt was going to get on pain management. Pt sts that she works [employer].

## 2023-07-03 ENCOUNTER — Ambulatory Visit (INDEPENDENT_AMBULATORY_CARE_PROVIDER_SITE_OTHER): Payer: Medicaid Other

## 2023-07-03 ENCOUNTER — Ambulatory Visit
Admission: EM | Admit: 2023-07-03 | Discharge: 2023-07-03 | Disposition: A | Discharge status: Home / Self Care | Payer: Medicaid Other | Attending: Physician Assistant | Admitting: Physician Assistant

## 2023-07-03 DIAGNOSIS — M79604 Pain in right leg: Secondary | ICD-10-CM

## 2023-07-03 DIAGNOSIS — S9001XA Contusion of right ankle, initial encounter: Secondary | ICD-10-CM | POA: Diagnosis not present

## 2023-07-03 DIAGNOSIS — S93401A Sprain of unspecified ligament of right ankle, initial encounter: Secondary | ICD-10-CM

## 2023-07-03 DIAGNOSIS — M7989 Other specified soft tissue disorders: Secondary | ICD-10-CM

## 2023-07-03 MED ORDER — KETOROLAC TROMETHAMINE 60 MG/2ML IM SOLN
30.0000 mg | Freq: Once | INTRAMUSCULAR | Status: AC
  Administered 2023-07-03: 30 mg via INTRAMUSCULAR

## 2023-07-03 MED ORDER — HYDROCODONE-ACETAMINOPHEN 5-325 MG PO TABS: 1.0000 | ORAL_TABLET | Freq: Four times a day (QID) | ORAL | 0 refills | Status: AC | PRN

## 2023-07-03 NOTE — ED Provider Notes (Signed)
[facility]    CSN: [id number] Arrival date & time: 07/03/23  1855      History   Chief Complaint Chief Complaint  Patient presents with   Foot Pain   Fall    HPI Donna Howard is a 53 y.o. female presenting for 2-day history of right foot and ankle pain and swelling.  Patient reports falling and twisting her foot and ankle when coming down the stairs a couple days ago. No numbness, tingling or weakness. Pain and difficulty weightbearing.  She has been applying ice and taking OTC meds without relief.  Concern for possible fracture.  HPI  Past Medical History:  Diagnosis Date   Anxiety    Asthma    Asthma    Back pain     Patient Active Problem List   Diagnosis Date Noted   Fall 10/24/2022   Acute right ankle pain 10/24/2022   Musculoskeletal pain 10/24/2022   Encounter to establish care 08/10/2021   History of asthma 08/10/2021   Chronic lower back pain 08/10/2021   Anxiety 08/10/2021   Smoking 08/10/2021    Past Surgical History:  Procedure Laterality Date   TUBAL LIGATION      OB History   No obstetric history on file.      Home Medications    Prior to Admission medications   Medication Sig Start Date End Date Taking? Authorizing Provider  albuterol (PROVENTIL HFA) 108 (90 Base) MCG/ACT inhaler Inhale 2 puffs into the lungs once every 6 (six) hours as needed for wheezing or shortness of breath. 08/10/21   Iloabachie, Chioma E, NP  albuterol (VENTOLIN HFA) 108 (90 Base) MCG/ACT inhaler Inhale 1-2 puffs into the lungs every 6 (six) hours as needed for wheezing or shortness of breath. 10/20/21   Shirlee Latch, PA-C  cetirizine (ZYRTEC) 10 MG tablet Take 1 tablet (10 mg total) by mouth daily. 08/02/15   Cuthriell, Delorise Royals, PA-C  clonazePAM (KLONOPIN) 0.5 MG tablet Take 0.5 mg by mouth 2 (two) times daily as needed. 11/21/21   [provider]  cyclobenzaprine (FLEXERIL) 10 MG tablet Take 1 tablet (10 mg total) by mouth 2 (two) times daily  as needed for muscle spasms. 04/23/23   Radford Pax, NP  gabapentin (NEURONTIN) 100 MG capsule Take 1 capsule (100 mg total) by mouth 2 (two) times daily. 08/25/21   Iloabachie, Chioma E, NP  ipratropium (ATROVENT) 0.06 % nasal spray Place 2 sprays into both nostrils 4 (four) times daily. 01/08/23   Becky Augusta, NP  lidocaine (XYLOCAINE) 2 % solution Use as directed 10 mLs in the mouth or throat every 4 (four) hours as needed for mouth pain. Gargle and spit 10/05/21   Cuthriell, Delorise Royals, PA-C  medroxyPROGESTERone (PROVERA) 5 MG tablet Take 1 tablet (5 mg total) by mouth daily. 03/31/23 03/30/24  Fisher, Roselyn Bering, PA-C  Menthol, Topical Analgesic, (BIOFREEZE) 4 % GEL Apply topically. As needed for back pain    [provider]  QUEtiapine (SEROQUEL) 100 MG tablet Take 100 mg by mouth at bedtime.    [provider]  sertraline (ZOLOFT) 50 MG tablet Take 50 mg by mouth daily. 12/18/21   [provider]    Family History Family History  Problem Relation Age of Onset   Hyperlipidemia Mother    Hypertension Mother    Diabetes Mother        pre-diabetes   Other Father        accidental electrocution  Hypertension Brother    Diabetes Brother    Heart attack Maternal Grandmother    Other Maternal Grandfather        unknown medical history   Diabetes Paternal Grandmother    Heart attack Paternal Grandfather     Social History Social History   Tobacco Use   Smoking status: Every Day    Current packs/day: 1.00    Average packs/day: 1 pack/day for 24.0 years (24.0 ttl pk-yrs)    Types: Cigarettes   Smokeless tobacco: Never  Vaping Use   Vaping status: Never Used  Substance Use Topics   Alcohol use: Yes    Comment: occ   Drug use: No     Allergies   Patient has no known allergies.   Review of Systems Review of Systems  Musculoskeletal:  Positive for arthralgias, gait problem and joint swelling.  Skin:  Positive for color change. Negative for wound.   Neurological:  Negative for weakness and numbness.     Physical Exam Triage Vital Signs ED Triage Vitals  Encounter Vitals Group     BP      Systolic BP Percentile      Diastolic BP Percentile      Pulse      Resp      Temp      Temp src      SpO2      Weight      Height      Head Circumference      Peak Flow      Pain Score      Pain Loc      Pain Education      Exclude from Growth Chart    No data found.  Updated Vital Signs BP 103/85 (BP Location: Left Arm)   Pulse 80   Temp 97.7 F (36.5 C) (Oral)   Resp 16   Ht 5\' 7"  (1.702 m)   Wt 140 lb (63.5 kg)   LMP 06/12/2023 (Approximate)   SpO2 100%   BMI 21.93 kg/m      Physical Exam Vitals and nursing note reviewed.  Constitutional:      General: She is not in acute distress.    Appearance: Normal appearance. She is not ill-appearing or toxic-appearing.  HENT:     Head: Normocephalic and atraumatic.  Eyes:     General: No scleral icterus.       Right eye: No discharge.        Left eye: No discharge.     Conjunctiva/sclera: Conjunctivae normal.  Cardiovascular:     Rate and Rhythm: Normal rate and regular rhythm.     Heart sounds: Normal heart sounds.  Pulmonary:     Effort: Pulmonary effort is normal. No respiratory distress.     Breath sounds: Normal breath sounds.  Musculoskeletal:     Cervical back: Neck supple.  Skin:    General: Skin is dry.  Neurological:     General: No focal deficit present.     Mental Status: She is alert. Mental status is at baseline.     Motor: No weakness.     Gait: Gait normal.  Psychiatric:        Mood and Affect: Mood normal.        Behavior: Behavior normal.      UC Treatments / Results  Labs (all labs ordered are listed, but only abnormal results are displayed) Labs Reviewed - No data to display  EKG   Radiology No  results found.  Procedures Procedures (including critical care time)  Medications Ordered in UC Medications - No data to  display  Initial Impression / Assessment and Plan / UC Course  I have reviewed the triage vital signs and the nursing notes.  Pertinent labs & imaging results that were available during my care of the patient were reviewed by me and considered in my medical decision making (see chart for details).     *** Final Clinical Impressions(s) / UC Diagnoses   Final diagnoses:  Pain and swelling of right lower extremity   Discharge Instructions   None    ED Prescriptions   None    PDMP not reviewed this encounter.

## 2023-07-03 NOTE — ED Triage Notes (Signed)
Pt c/o R foot pain x2 days. States she fell coming down some stairs.

## 2023-07-03 NOTE — Discharge Instructions (Signed)
SPRAIN: Stressed avoiding painful activities . Reviewed RICE guidelines. Use medications as directed, including NSAIDs. If no NSAIDs have been prescribed for you today, you may take Aleve or Motrin over the counter. May use Tylenol in between doses of NSAIDs.  If no improvement in the next 1-2 weeks, f/u with PCP or return to our office for reexamination, and please feel free to call or return at any time for any questions or concerns you may have and we will be happy to help you!

## 2023-07-05 ENCOUNTER — Ambulatory Visit
Admission: EM | Admit: 2023-07-05 | Discharge: 2023-07-05 | Disposition: A | Discharge status: Home / Self Care | Payer: Medicaid Other | Attending: Family Medicine | Admitting: Family Medicine

## 2023-07-05 DIAGNOSIS — G8929 Other chronic pain: Secondary | ICD-10-CM

## 2023-07-05 DIAGNOSIS — M545 Low back pain, unspecified: Secondary | ICD-10-CM | POA: Diagnosis not present

## 2023-07-05 MED ORDER — KETOROLAC TROMETHAMINE 30 MG/ML IJ SOLN
60.0000 mg | Freq: Once | INTRAMUSCULAR | Status: AC
  Administered 2023-07-05: 60 mg via INTRAMUSCULAR

## 2023-07-05 MED ORDER — KETOROLAC TROMETHAMINE 60 MG/2ML IM SOLN: 60.0000 mg | Freq: Once | INTRAMUSCULAR | 0 refills | Status: AC

## 2023-07-05 NOTE — ED Provider Notes (Signed)
[facility]    CSN: [id number] Arrival date & time: 07/05/23  1624      History   Chief Complaint Chief Complaint  Patient presents with   Back Pain    HPI  HPI Donna Howard is a 53 y.o. female.   Donna Howard presents for low back pain that started the other night.  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  She has not been able to get an appt with her PCP.  She has not yet  started seeing the pain clinic. Has a right sided "slipped disc."  Continues to have right ankle pain.  Bending over makes the pain worse. Feels like "someone is stretching a rubberband."  Took Advil-Duo, BC, muscle relaxer, Gabapentin and nothing is helping. She doesn't take the Flexeril before work as it makes her sleepy.  She doesn't start physical therapy until the 28th of March. Was seen here at the urgent care and given pain medication for her acute ankle pain but she has since run out.       Past Medical History:  Diagnosis Date   Anxiety    Asthma    Asthma    Back pain     Patient Active Problem List   Diagnosis Date Noted   Fall 10/24/2022   Acute right ankle pain 10/24/2022   Musculoskeletal pain 10/24/2022   Encounter to establish care 08/10/2021   History of asthma 08/10/2021   Chronic lower back pain 08/10/2021   Anxiety 08/10/2021   Smoking 08/10/2021    Past Surgical History:  Procedure Laterality Date   TUBAL LIGATION      OB History   No obstetric history on file.      Home Medications    Prior to Admission medications   Medication Sig Start Date End Date Taking? Authorizing Provider  albuterol (PROVENTIL HFA) 108 (90 Base) MCG/ACT inhaler Inhale 2 puffs into the lungs once every 6 (six) hours as needed for wheezing or shortness of breath. 08/10/21  Yes Iloabachie, Chioma E, NP  albuterol (VENTOLIN HFA) 108 (90 Base) MCG/ACT inhaler Inhale 1-2 puffs into the lungs every 6 (six) hours as needed for wheezing or shortness of  breath. 10/20/21  Yes Shirlee Latch, PA-C  cetirizine (ZYRTEC) 10 MG tablet Take 1 tablet (10 mg total) by mouth daily. 08/02/15  Yes Cuthriell, Delorise Royals, PA-C  clonazePAM (KLONOPIN) 0.5 MG tablet Take 0.5 mg by mouth 2 (two) times daily as needed. 11/21/21  Yes [provider]  cyclobenzaprine (FLEXERIL) 10 MG tablet Take 1 tablet (10 mg total) by mouth 2 (two) times daily as needed for muscle spasms. 04/23/23  Yes Radford Pax, NP  gabapentin (NEURONTIN) 100 MG capsule Take 1 capsule (100 mg total) by mouth 2 (two) times daily. 08/25/21  Yes Iloabachie, Chioma E, NP  ipratropium (ATROVENT) 0.06 % nasal spray Place 2 sprays into both nostrils 4 (four) times daily. 01/08/23  Yes Becky Augusta, NP  lidocaine (XYLOCAINE) 2 % solution Use as directed 10 mLs in the mouth or throat every 4 (four) hours as needed for mouth pain. Gargle and spit 10/05/21  Yes Cuthriell, Delorise Royals, PA-C  medroxyPROGESTERone (PROVERA) 5 MG tablet Take 1 tablet (5 mg total) by mouth daily. 03/31/23 03/30/24 Yes Fisher, Roselyn Bering, PA-C  Menthol, Topical Analgesic, (BIOFREEZE) 4 % GEL Apply topically. As needed for back pain   Yes [provider]  QUEtiapine (SEROQUEL) 100 MG tablet Take  100 mg by mouth at bedtime.   Yes [provider]  sertraline (ZOLOFT) 50 MG tablet Take 50 mg by mouth daily. 12/18/21  Yes [provider]    Family History Family History  Problem Relation Age of Onset   Hyperlipidemia Mother    Hypertension Mother    Diabetes Mother        pre-diabetes   Other Father        accidental electrocution   Hypertension Brother    Diabetes Brother    Heart attack Maternal Grandmother    Other Maternal Grandfather        unknown medical history   Diabetes Paternal Grandmother    Heart attack Paternal Grandfather     Social History Social History   Tobacco Use   Smoking status: Every Day    Current packs/day: 1.00    Average packs/day: 1 pack/day for 24.0 years (24.0 ttl  pk-yrs)    Types: Cigarettes   Smokeless tobacco: Never  Vaping Use   Vaping status: Never Used  Substance Use Topics   Alcohol use: Yes    Comment: occ   Drug use: No     Allergies   Patient has no known allergies.   Review of Systems Review of Systems: egative unless otherwise stated in HPI.      Physical Exam Triage Vital Signs ED Triage Vitals  Encounter Vitals Group     BP 07/05/23 1719 123/84     Systolic BP Percentile --      Diastolic BP Percentile --      Pulse Rate 07/05/23 1719 79     Resp --      Temp 07/05/23 1719 98.7 F (37.1 C)     Temp Source 07/05/23 1719 Oral     SpO2 07/05/23 1719 97 %     Weight 07/05/23 1717 140 lb (63.5 kg)     Height 07/05/23 1717 5\' 7"  (1.702 m)     Head Circumference --      Peak Flow --      Pain Score 07/05/23 1717 9     Pain Loc --      Pain Education --      Exclude from Growth Chart --    No data found.  Updated Vital Signs BP 123/84 (BP Location: Left Arm)   Pulse 79   Temp 98.7 F (37.1 C) (Oral)   Ht 5\' 7"  (1.702 m)   Wt 63.5 kg   LMP 06/12/2023 (Approximate)   SpO2 97%   BMI 21.93 kg/m   Visual Acuity Right Eye Distance:   Left Eye Distance:   Bilateral Distance:    Right Eye Near:   Left Eye Near:    Bilateral Near:     Physical Exam GEN: well appearing female in no acute distress  CVS: well perfused  RESP: speaking in full sentences without pause, no respiratory distress  MSK:  Lumbar spine: - Inspection: no gross deformity or asymmetry, swelling or ecchymosis. No skin changes  - Palpation: TTP over the spinous processes with bilateral lumbar paraspinal muscle tenderness, no SI joint tenderness bilaterally - ROM: good active ROM of the lumbar spine in flexion and extension but with pain - Strength: 5/5 strength of lower extremity in L4-S1 nerve root distributions b/l - Neuro: sensation intact in the L4-S1 nerve root distribution b/l SKIN: warm, dry, no overly skin rash or erythema     UC Treatments / Results  Labs (all labs ordered are listed,  but only abnormal results are displayed) Labs Reviewed - No data to display  EKG   Radiology    Procedures Procedures (including critical care time)  Medications Ordered in UC Medications  ketorolac (TORADOL) 30 MG/ML injection 60 mg (60 mg Intramuscular Given 07/05/23 1820)    Initial Impression / Assessment and Plan / UC Course  I have reviewed the triage vital signs and the nursing notes.  Pertinent labs & imaging results that were available during my care of the patient were reviewed by me and considered in my medical decision making (see chart for details).      Pt is a 53 y.o.  female with acute on chronic back pain.  Pt asking for pain medication. She says she has not been able to get into the pain clinic yet. Pt seen here on 06/01/23 for chronic back pain.  It was "explained and documented on AVS that she should not to get any further opioids for her chronic pain here".    Pt upset as she got 8 Norco for acute ankle pain after a fall 2 days ago.  She wants more pain medication as what she is taking isn't helping. Discussed increasing her Gabapentin to which she expressed that its not going to help. Says she is allergic to steroids and declined them. Discussed multimodal pain control for chronic back pain that doesn't include opioids.  She is interested in Toradol injection. Given Toradon 60 mg IM.  She has not taken any NSAIDs today and she was advised not to take any today.    Patient to gradually return to normal activities, as tolerated and continue ordinary activities within the limits permitted by pain. Prescribed Gabapentin, Lidocaine patches, Tylenol and muscle relaxers for pain relief.  Keep her physical therapy appointment as schedule. Provided pt with the pain clinic referral contact information that was provided by her PCP.   Counseled patient on red flag symptoms and when to seek immediate care.  Discussed how working at [employer] can aggravate back pain but she states she can not changes jobs right now. Patient to follow up with spine specialist, if symptoms do not improve with conservative treatment.  Return and ED precautions given.    Discussed MDM, treatment plan and plan for follow-up with patient who agrees with plan.   Final Clinical Impressions(s) / UC Diagnoses   Final diagnoses:  Chronic bilateral low back pain, unspecified whether sciatica present     Discharge Instructions      To schedule your pain clinic appointment: Please call [phone] as directed by your PCP.   Continue taking Tylenol, ibuprofen, Gabapentin, muscle relaxer and using Lidocaine patches for pain relief.  Chronic pain is mostly managed by multimodal pain relief methods.  Your appointment as scheduled with your physical therapist.     ED Prescriptions     Medication Sig Dispense Auth. Provider      PDMP not reviewed this encounter.   Katha Cabal, DO 07/09/23 2032

## 2023-07-05 NOTE — ED Triage Notes (Signed)
Patient reports she was here the other day about her right ankle.   Patient reports that she is here today because she was at work last night bending over and her back hurts.

## 2023-07-05 NOTE — Discharge Instructions (Addendum)
To schedule your pain clinic appointment: Please call [phone] as directed by your PCP.   Continue taking Tylenol, ibuprofen, Gabapentin, muscle relaxer and using Lidocaine patches for pain relief.  Chronic pain is mostly managed by multimodal pain relief methods.  Your appointment as scheduled with your physical therapist.

## 2024-01-15 ENCOUNTER — Ambulatory Visit
Admission: EM | Admit: 2024-01-15 | Discharge: 2024-01-15 | Disposition: A | Discharge status: Home / Self Care | Attending: Emergency Medicine | Admitting: Emergency Medicine

## 2024-01-15 DIAGNOSIS — G8929 Other chronic pain: Secondary | ICD-10-CM | POA: Diagnosis not present

## 2024-01-15 DIAGNOSIS — M545 Low back pain, unspecified: Secondary | ICD-10-CM | POA: Diagnosis not present

## 2024-01-15 HISTORY — DX: Unspecified osteoarthritis, unspecified site: M19.90

## 2024-01-15 MED ORDER — KETOROLAC TROMETHAMINE 30 MG/ML IJ SOLN
30.0000 mg | Freq: Once | INTRAMUSCULAR | Status: AC
  Administered 2024-01-15: 30 mg via INTRAMUSCULAR

## 2024-01-15 NOTE — ED Provider Notes (Signed)
 HPI  SUBJECTIVE:  Donna Howard is a 53 y.o. female who presents with a flare of her chronic right back pain that she describes as throbbing, constant with radiation down both of her legs for the past 3 days.  She works at [employer] and does a lot of repetitive lifting.  No direct trauma, saddle anesthesia, urinary fecal incontinence, urinary retention, fevers, urinary complaints, leg weakness.  She has had bilateral radicular pain before.  She tried rest, ibuprofen, heating pad, takes Flexeril  only at night and gabapentin  as directed.  Ibuprofen, gabapentin  and Flexeril  help.  States she only takes the Flexeril  at night because it is too sedating.  Lying and sitting for too long aggravates the pain.  She has a past medical history of chronic low back pain, sciatica, asthma, anxiety, smoking.  No history of IVDU, chronic kidney disease, aortic abdominal aneurysm, PUD, GI bleed.  She has been evaluated by an orthopedic spine specialist, and currently is a patient in a pain clinic.  She states that she has an appointment with them on 9/17.  Past Medical History:  Diagnosis Date   Anxiety    Arthritis    Asthma    Asthma    Back pain     Past Surgical History:  Procedure Laterality Date   TUBAL LIGATION      Family History  Problem Relation Age of Onset   Hyperlipidemia Mother    Hypertension Mother    Diabetes Mother        pre-diabetes   Other Father        accidental electrocution   Hypertension Brother    Diabetes Brother    Heart attack Maternal Grandmother    Other Maternal Grandfather        unknown medical history   Diabetes Paternal Grandmother    Heart attack Paternal Grandfather     Social History   Tobacco Use   Smoking status: Every Day    Current packs/day: 1.00    Average packs/day: 1 pack/day for 24.0 years (24.0 ttl pk-yrs)    Types: Cigarettes   Smokeless tobacco: Never  Vaping Use   Vaping status: Never Used  Substance Use Topics   Alcohol use: Yes     Comment: occ   Drug use: No    No current facility-administered medications for this encounter.  Current Outpatient Medications:    ALPRAZolam (XANAX) 1 MG tablet, Take 1 mg by mouth 2 (two) times daily., Disp: , Rfl:    cetirizine  (ZYRTEC ) 10 MG tablet, Take 1 tablet (10 mg total) by mouth daily., Disp: 30 tablet, Rfl: 0   oxyCODONE  (OXY IR/ROXICODONE ) 5 MG immediate release tablet, , Disp: , Rfl:    QUEtiapine (SEROQUEL) 100 MG tablet, Take 100 mg by mouth at bedtime., Disp: , Rfl:    albuterol  (PROVENTIL  HFA) 108 (90 Base) MCG/ACT inhaler, Inhale 2 puffs into the lungs once every 6 (six) hours as needed for wheezing or shortness of breath., Disp: 6.7 g, Rfl: 4   albuterol  (VENTOLIN  HFA) 108 (90 Base) MCG/ACT inhaler, Inhale 1-2 puffs into the lungs every 6 (six) hours as needed for wheezing or shortness of breath., Disp: 1 g, Rfl: 1   clonazePAM (KLONOPIN) 0.5 MG tablet, Take 0.5 mg by mouth 2 (two) times daily as needed., Disp: , Rfl:    cyclobenzaprine  (FLEXERIL ) 10 MG tablet, Take 1 tablet (10 mg total) by mouth 2 (two) times daily as needed for muscle spasms., Disp: 10 tablet, Rfl: 0  gabapentin  (NEURONTIN ) 100 MG capsule, Take 1 capsule (100 mg total) by mouth 2 (two) times daily., Disp: 14 capsule, Rfl: 0   ipratropium (ATROVENT ) 0.06 % nasal spray, Place 2 sprays into both nostrils 4 (four) times daily., Disp: 15 mL, Rfl: 12   lidocaine  (XYLOCAINE ) 2 % solution, Use as directed 10 mLs in the mouth or throat every 4 (four) hours as needed for mouth pain. Gargle and spit, Disp: 200 mL, Rfl: 0   medroxyPROGESTERone  (PROVERA ) 5 MG tablet, Take 1 tablet (5 mg total) by mouth daily., Disp: 10 tablet, Rfl: 0   Menthol, Topical Analgesic, (BIOFREEZE) 4 % GEL, Apply topically. As needed for back pain, Disp: , Rfl:    sertraline (ZOLOFT) 50 MG tablet, Take 50 mg by mouth daily., Disp: , Rfl:   No Known Allergies   ROS  As noted in HPI.   Physical Exam  BP (!) 141/90 (BP Location:  Right Arm)   Pulse 78   Temp 98.5 F (36.9 C) (Oral)   Resp 19   SpO2 96%   Constitutional: Well developed, well nourished, no acute distress Eyes:  EOMI, conjunctiva normal bilaterally HENT: Normocephalic, atraumatic,mucus membranes moist Respiratory: Normal inspiratory effort Cardiovascular: Normal rate GI: nondistended. skin: No rash, skin intact Musculoskeletal: no CVAT.  No paralumbar tenderness, no muscle spasm.  Positive tenderness at L5/S1.  No other L-spine, SI joint, sacral bony tenderness. Bilateral lower extremities nontender, baseline ROM with intact DP pulses.  Sensation between thighs intact. No pain with passive int/ext rotation, flex/extension hips, AD/ABduction bilaterally. SLR neg bilaterally. Sensation baseline light touch bilaterally for Pt, DTR's symmetric and intact bilaterally KJ, Motor symmetric bilateral 5/5 hip flexion, quadriceps, hamstrings, EHL, foot dorsiflexion, foot plantarflexion, gait somewhat antalgic but without apparent new ataxia. Neurologic: Alert & oriented x 3, no focal neuro deficits Psychiatric: Speech and behavior appropriate   ED Course   Medications  ketorolac  (TORADOL ) 30 MG/ML injection 30 mg (30 mg Intramuscular Given 01/15/24 1958)    No orders of the defined types were placed in this encounter.   No results found for this or any previous visit (from the past 24 hours). No results found.  ED Clinical Impression  1. Acute exacerbation of chronic low back pain     ED Assessment/Plan     This is an acute exacerbation of a chronic problem.  The patient presents with back pain accompanied by bilateral radicular pain, which she has had before.  However, in the absence of direct trauma, I believe that plain films would be low yield.  she does have access to her spine specialist.  I offered to get plain films tonight, but I believe that following up with orthopedics for advanced imaging would provide more specific information.   Discussed this with her.  She has declined plain films tonight.  There are no other signs or symptoms concerning for cauda equina.  Will give shot of Toradol  30 mg IM here, which she states has worked well for her in the past.  She states that she will consider trying 1000 mg of Tylenol  when she gets home.  She declined Tylenol  here.  She states that she cannot take steroids as they make her go crazy.  She declined prescription of Zanaflex, states she would like to continue the Flexeril  and gabapentin , has plenty of both of these.  She has lidocaine  patches at home, but states that they do not help.  She declined a work note.  Discussed  MDM, treatment plan,  and plan for follow-up with patient. Discussed sn/sx that should prompt return to the ED. patient agrees with plan.   Meds ordered this encounter  Medications   ketorolac  (TORADOL ) 30 MG/ML injection 30 mg    *This clinic note was created using Scientist, clinical (histocompatibility and immunogenetics). Therefore, there may be occasional mistakes despite careful proofreading.  ?     Van Knee, MD 01/18/24 1118

## 2024-01-15 NOTE — Discharge Instructions (Signed)
 I have given you a shot of Toradol  30 mg IM here.  Consider taking 1000 mg of Tylenol  when you get home with food.  The combination of NSAIDs and Tylenol  is very effective and provides pain relief almost equivalent to opiates.  When you take ibuprofen, take 1000 mg of Tylenol  with it.  I advise 600 mg of ibuprofen with 1000 mg of Tylenol  3 or 4 times a day as needed.  Continue your Flexeril , gabapentin , heat.  Please follow-up with your spine specialist at [facility] ASAP as you may need repeat advanced imaging.

## 2024-01-15 NOTE — ED Triage Notes (Addendum)
 Patient states that she has a hx of sciatic pain. States that right side lower back- bilateral legs and butt have been hurting since sat. Patient asked for Toradol  shot

## 2024-02-08 ENCOUNTER — Ambulatory Visit: Payer: PRIVATE HEALTH INSURANCE

## 2024-06-09 ENCOUNTER — Ambulatory Visit: Admission: EM | Admit: 2024-06-09 | Discharge: 2024-06-09 | Disposition: A | Discharge status: Home / Self Care

## 2024-06-09 ENCOUNTER — Encounter: Payer: Self-pay | Admitting: Emergency Medicine

## 2024-06-09 DIAGNOSIS — M545 Low back pain, unspecified: Secondary | ICD-10-CM

## 2024-06-09 DIAGNOSIS — G8929 Other chronic pain: Secondary | ICD-10-CM | POA: Diagnosis not present

## 2024-06-09 MED ORDER — KETOROLAC TROMETHAMINE 30 MG/ML IJ SOLN
30.0000 mg | Freq: Once | INTRAMUSCULAR | Status: AC
  Administered 2024-06-09: 30 mg via INTRAMUSCULAR

## 2024-06-09 NOTE — ED Provider Notes (Signed)
 " [facility]    CSN: [id number] Arrival date & time: 06/09/24  1457      History   Chief Complaint Chief Complaint  Patient presents with   Back Pain    HPI Donna Howard is a 54 y.o. female.   HPI  54 year old female with past medical hist significant for chronic low back pain presents for evaluation of right-sided low back pain radiating into her right leg.  Has a known history of sciatica.  Is currently followed by the pain clinic.  Past Medical History:  Diagnosis Date   Anxiety    Arthritis    Asthma    Asthma    Back pain     Patient Active Problem List   Diagnosis Date Noted   Fall 10/24/2022   Acute right ankle pain 10/24/2022   Musculoskeletal pain 10/24/2022   Encounter to establish care 08/10/2021   History of asthma 08/10/2021   Chronic lower back pain 08/10/2021   Anxiety 08/10/2021   Smoking 08/10/2021    Past Surgical History:  Procedure Laterality Date   TUBAL LIGATION      OB History   No obstetric history on file.      Home Medications    Prior to Admission medications  Medication Sig Start Date End Date Taking? Authorizing Provider  DULoxetine (CYMBALTA) 20 MG capsule Take 20 mg by mouth at bedtime. 02/21/24  Yes [provider]  albuterol  (PROVENTIL  HFA) 108 (90 Base) MCG/ACT inhaler Inhale 2 puffs into the lungs once every 6 (six) hours as needed for wheezing or shortness of breath. 08/10/21   Iloabachie, Chioma E, NP  albuterol  (VENTOLIN  HFA) 108 (90 Base) MCG/ACT inhaler Inhale 1-2 puffs into the lungs every 6 (six) hours as needed for wheezing or shortness of breath. 10/20/21   Arvis Jolan NOVAK, PA-C  ALPRAZolam (XANAX) 1 MG tablet Take 1 mg by mouth 2 (two) times daily. 01/12/24   [provider]  cetirizine  (ZYRTEC ) 10 MG tablet Take 1 tablet (10 mg total) by mouth daily. 08/02/15   Cuthriell, Dorn BIRCH, PA-C  clonazePAM (KLONOPIN) 0.5 MG tablet Take 0.5 mg by mouth 2 (two) times daily as needed. 11/21/21    [provider]  cyclobenzaprine  (FLEXERIL ) 10 MG tablet Take 1 tablet (10 mg total) by mouth 2 (two) times daily as needed for muscle spasms. 04/23/23   Mayer, Jodi R, NP  gabapentin  (NEURONTIN ) 100 MG capsule Take 1 capsule (100 mg total) by mouth 2 (two) times daily. 08/25/21   Iloabachie, Chioma E, NP  ipratropium (ATROVENT ) 0.06 % nasal spray Place 2 sprays into both nostrils 4 (four) times daily. 01/08/23   Bernardino Ditch, NP  lidocaine  (XYLOCAINE ) 2 % solution Use as directed 10 mLs in the mouth or throat every 4 (four) hours as needed for mouth pain. Gargle and spit 10/05/21   Cuthriell, Dorn BIRCH, PA-C  medroxyPROGESTERone  (PROVERA ) 5 MG tablet Take 1 tablet (5 mg total) by mouth daily. 03/31/23 03/30/24  Fisher, Devere ORN, PA-C  Menthol, Topical Analgesic, (BIOFREEZE) 4 % GEL Apply topically. As needed for back pain    [provider]  oxyCODONE  (OXY IR/ROXICODONE ) 5 MG immediate release tablet  04/20/22   [provider]  QUEtiapine (SEROQUEL) 100 MG tablet Take 100 mg by mouth at bedtime.    [provider]  sertraline (ZOLOFT) 50 MG tablet Take 50 mg by mouth daily. 12/18/21   [provider]    Family History Family History  Problem Relation Age of Onset   Hyperlipidemia Mother    Hypertension Mother    Diabetes Mother        pre-diabetes   Other Father        accidental electrocution   Hypertension Brother    Diabetes Brother    Heart attack Maternal Grandmother    Other Maternal Grandfather        unknown medical history   Diabetes Paternal Grandmother    Heart attack Paternal Grandfather     Social History Social History[1]   Allergies   Patient has no known allergies.   Review of Systems Review of Systems  Musculoskeletal:  Positive for back pain.     Physical Exam Triage Vital Signs ED Triage Vitals  Encounter Vitals Group     BP 06/09/24 1551 128/88     Girls Systolic BP Percentile --      Girls Diastolic BP  Percentile --      Boys Systolic BP Percentile --      Boys Diastolic BP Percentile --      Pulse Rate 06/09/24 1551 96     Resp 06/09/24 1551 18     Temp 06/09/24 1551 98.2 F (36.8 C)     Temp src --      SpO2 06/09/24 1551 99 %     Weight 06/09/24 1548 166 lb (75.3 kg)     Height --      Head Circumference --      Peak Flow --      Pain Score 06/09/24 1549 8     Pain Loc --      Pain Education --      Exclude from Growth Chart --    No data found.  Updated Vital Signs BP 128/88 (BP Location: Left Arm)   Pulse 96   Temp 98.2 F (36.8 C)   Resp 18   Wt 166 lb (75.3 kg)   SpO2 99%   BMI 26.00 kg/m   Visual Acuity Right Eye Distance:   Left Eye Distance:   Bilateral Distance:    Right Eye Near:   Left Eye Near:    Bilateral Near:     Physical Exam Vitals and nursing note reviewed.  Constitutional:      Appearance: Normal appearance. She is not ill-appearing.  HENT:     Head: Normocephalic and atraumatic.  Musculoskeletal:        General: Tenderness present.  Skin:    General: Skin is warm and dry.     Capillary Refill: Capillary refill takes less than 2 seconds.     Findings: No erythema or rash.  Neurological:     General: No focal deficit present.     Mental Status: She is alert and oriented to person, place, and time.      UC Treatments / Results  Labs (all labs ordered are listed, but only abnormal results are displayed) Labs Reviewed - No data to display  EKG   Radiology No results found.  Procedures Procedures (including critical care time)  Medications Ordered in UC Medications  ketorolac  (TORADOL ) 30 MG/ML injection 30 mg (has no administration in time range)    Initial Impression / Assessment and Plan / UC Course  I have reviewed the triage vital signs and the nursing notes.  Pertinent labs & imaging results that were available during my care of the patient were reviewed by me and considered in my medical decision making (see  chart for details).  Patient is a nontoxic-appearing 54 year old female presenting for evaluation of acute on chronic right sided low back pain.  She is followed by the pain clinic and reports that her medication ran out on the 17th of this month.  She has contacted her pain clinic for refill but has not heard back from them.  I advised her that we cannot refill her pain medication, especially given that she is under pain contract with the pain clinic, but we can treat the sciatica.  She reports that she does not do well with steroids and is requesting a shot of Toradol .  I will order 30 mg of IM Toradol  for the patient sciatica.   Final Clinical Impressions(s) / UC Diagnoses   Final diagnoses:  Acute on chronic low back pain     Discharge Instructions      You may continue to use over-the-counter ibuprofen or Aleve  according to the package instructions to help you with your low back pain and sciatica.  Follow the physical therapy exercises given in your discharge instructions.  Call your pain clinic in the morning to discuss a refill of your medication with them.     ED Prescriptions   None    I have reviewed the PDMP during this encounter.    [1]  Social History Tobacco Use   Smoking status: Every Day    Current packs/day: 1.00    Average packs/day: 1 pack/day for 24.0 years (24.0 ttl pk-yrs)    Types: Cigarettes   Smokeless tobacco: Never  Vaping Use   Vaping status: Never Used  Substance Use Topics   Alcohol use: Yes    Comment: occ   Drug use: No     Bernardino Ditch, NP 06/09/24 1559  "

## 2024-06-09 NOTE — ED Triage Notes (Signed)
 Pt presents with lower back pain that radiates down her right leg started yesterday. Pt has not taken anything for the pain. Her medication has not been filled with pain management.

## 2024-06-09 NOTE — Discharge Instructions (Addendum)
 You may continue to use over-the-counter ibuprofen or Aleve  according to the package instructions to help you with your low back pain and sciatica.  Follow the physical therapy exercises given in your discharge instructions.  Call your pain clinic in the morning to discuss a refill of your medication with them.
